# Patient Record
Sex: Male | Born: 1975 | Race: White | Hispanic: No | Marital: Married | Smoking: Former smoker
Health system: Southern US, Community
[De-identification: ages and names within clinical notes are randomized; demographics above are authoritative.]

## PROBLEM LIST (undated history)

## (undated) DIAGNOSIS — M4800 Spinal stenosis, site unspecified: Secondary | ICD-10-CM

## (undated) DIAGNOSIS — J439 Emphysema, unspecified: Secondary | ICD-10-CM

## (undated) DIAGNOSIS — G709 Myoneural disorder, unspecified: Secondary | ICD-10-CM

## (undated) DIAGNOSIS — K219 Gastro-esophageal reflux disease without esophagitis: Secondary | ICD-10-CM

## (undated) DIAGNOSIS — M1990 Unspecified osteoarthritis, unspecified site: Secondary | ICD-10-CM

## (undated) HISTORY — DX: Gastro-esophageal reflux disease without esophagitis: K21.9

## (undated) HISTORY — DX: Unspecified osteoarthritis, unspecified site: M19.90

## (undated) HISTORY — DX: Myoneural disorder, unspecified: G70.9

## (undated) HISTORY — DX: Emphysema, unspecified: J43.9

## (undated) HISTORY — PX: BACK SURGERY: SHX140

---

## 2015-03-21 ENCOUNTER — Emergency Department (HOSPITAL_COMMUNITY): Payer: BLUE CROSS/BLUE SHIELD

## 2015-03-21 ENCOUNTER — Encounter (HOSPITAL_COMMUNITY): Payer: Self-pay | Admitting: *Deleted

## 2015-03-21 ENCOUNTER — Emergency Department (HOSPITAL_COMMUNITY)
Admission: EM | Admit: 2015-03-21 | Discharge: 2015-03-21 | Disposition: A | Discharge status: Home / Self Care | Payer: BLUE CROSS/BLUE SHIELD | Attending: Emergency Medicine | Admitting: Emergency Medicine

## 2015-03-21 DIAGNOSIS — Z8739 Personal history of other diseases of the musculoskeletal system and connective tissue: Secondary | ICD-10-CM | POA: Diagnosis not present

## 2015-03-21 DIAGNOSIS — H539 Unspecified visual disturbance: Secondary | ICD-10-CM | POA: Diagnosis not present

## 2015-03-21 DIAGNOSIS — R0789 Other chest pain: Secondary | ICD-10-CM | POA: Diagnosis not present

## 2015-03-21 DIAGNOSIS — Z720 Tobacco use: Secondary | ICD-10-CM | POA: Insufficient documentation

## 2015-03-21 DIAGNOSIS — R079 Chest pain, unspecified: Secondary | ICD-10-CM | POA: Diagnosis present

## 2015-03-21 HISTORY — DX: Spinal stenosis, site unspecified: M48.00

## 2015-03-21 LAB — BASIC METABOLIC PANEL
ANION GAP: 8 (ref 5–15)
BUN: 11 mg/dL (ref 6–23)
CHLORIDE: 101 mmol/L (ref 96–112)
CO2: 28 mmol/L (ref 19–32)
CREATININE: 0.86 mg/dL (ref 0.50–1.35)
Calcium: 9.6 mg/dL (ref 8.4–10.5)
GFR calc Af Amer: >90 mL/min (ref >90)
GFR calc non Af Amer: >90 mL/min (ref >90)
Glucose, Bld: 107 mg/dL — ABNORMAL HIGH (ref 70–99)
Potassium: 4.1 mmol/L (ref 3.5–5.1)
SODIUM: 137 mmol/L (ref 135–145)

## 2015-03-21 LAB — CBC
HCT: 39.4 % (ref 39.0–52.0)
Hemoglobin: 13.7 g/dL (ref 13.0–17.0)
MCH: 31.5 pg (ref 26.0–34.0)
MCHC: 34.8 g/dL (ref 30.0–36.0)
MCV: 90.6 fL (ref 78.0–100.0)
Platelets: 189 10*3/uL (ref 150–400)
RBC: 4.35 MIL/uL (ref 4.22–5.81)
RDW: 13.1 % (ref 11.5–15.5)
WBC: 8 10*3/uL (ref 4.0–10.5)

## 2015-03-21 LAB — I-STAT TROPONIN, ED: TROPONIN I, POC: 0 ng/mL (ref 0.00–0.08)

## 2015-03-21 MED ORDER — ACETAMINOPHEN 500 MG PO TABS: 1000.0000 mg | ORAL_TABLET | Freq: Once | ORAL | Status: DC

## 2015-03-21 MED ORDER — SODIUM CHLORIDE 0.9 % IV BOLUS (SEPSIS)
1000.0000 mL | Freq: Once | INTRAVENOUS | Status: AC
  Administered 2015-03-21: 1000 mL via INTRAVENOUS

## 2015-03-21 NOTE — ED Notes (Signed)
Pt states that he began having vision changes at 0900.pt was sent for eval by urgent care.  Pt states that he has had pain in his eyes as well. Pt reports left leg numbness. Pt then began having chest pain and sweating. Pt alert and oriented. Dr Rubin Payorpickering aware

## 2015-03-21 NOTE — ED Provider Notes (Signed)
CSN: [id number]     Arrival date & time 03/21/15  1050 History   First MD Initiated Contact with Patient 03/21/15 1130     Chief Complaint  Patient presents with  . Chest Pain  . Eye Problem     (Consider location/radiation/quality/duration/timing/severity/associated sxs/prior Treatment) HPI Parker ArbourCharlie Arias is a 39 y.o. male who comes in for evaluation of multiple complaints. 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 He reports going to an urgent care facility at 9:15 for his blurred vision had resolved but he began to experience left-sided tingling and numbness and self reported left-sided facial droop. At this point he was sent to the ED for evaluation and upon arrival in the ED he began to experience "chest sweatiness followed by intermittent dull pains". He reports eye pain now but cannot lateralize his discomfort. He rates this discomfort as 5/10. Nothing makes it better or worse area and no intervention is tried. He does report being fairly active and has not experienced any discomfort following exercise. Denies headache, chest pain now, difficulties breathing, nausea or vomiting, abdominal pain, diarrhea or constipation, dizziness, syncope, rash.  Past Medical History  Diagnosis Date  . Spinal stenosis    History reviewed. No pertinent past surgical history. No family history on file. History  Substance Use Topics  . Smoking status: Current Every Day Smoker -- 0.50 packs/day    Types: Cigarettes  . Smokeless tobacco: Not on file  . Alcohol Use: Not on file    Review of Systems A 10 point review of systems was completed and was negative except for pertinent positives and negatives as mentioned in the history of present illness     Allergies  Review of patient's allergies indicates no known allergies.  Home Medications   Prior to Admission medications   Medication  Sig Start Date End Date Taking? Authorizing Provider  acetaminophen (TYLENOL) 325 MG tablet Take 650 mg by mouth every 6 (six) hours as needed for mild pain.   Yes Historical Provider, MD   BP 113/59 mmHg  Pulse 55  Temp(Src) 97.5 F (36.4 C) (Oral)  Resp 18  SpO2 98% Physical Exam  Constitutional: He is oriented to person, place, and time. He appears well-developed and well-nourished.  HENT:  Head: Normocephalic and atraumatic.  Mouth/Throat: Oropharynx is clear and moist.  Eyes: Conjunctivae are normal. Pupils are equal, round, and reactive to light. Right eye exhibits no discharge. Left eye exhibits no discharge. No scleral icterus.  Neck: Neck supple.  Cardiovascular: Normal rate, regular rhythm and normal heart sounds.   Pulmonary/Chest: Effort normal and breath sounds normal. No respiratory distress. He has no wheezes. He has no rales.  Abdominal: Soft. There is no tenderness.  Musculoskeletal: He exhibits no tenderness.  Neurological: He is alert and oriented to person, place, and time.  Cranial Nerves II-XII grossly intact. Strength and sensation intact. Completes finger to nose and heel to shin coordination movements without difficulty. No pronator drift. Extraocular movements intact with very mild horizontal nystagmus with extreme right lateral gaze. Eye pain only with extreme right lateral gaze  Skin: Skin is warm and dry. No rash noted.  Psychiatric: He has a normal mood and affect.  Nursing note and vitals reviewed.   ED Course  Procedures (including critical care time) Labs Review Labs Reviewed  BASIC METABOLIC PANEL - Abnormal; Notable for the following:    Glucose, Bld 107 (*)  All other components within normal limits  CBC  I-STAT TROPOININ, ED  Rosezena Sensor, ED    Imaging Review Dg Chest 2 View  03/21/2015   CLINICAL DATA:  Blurred vision. Tingling and numbness of the left arm. Lightheaded and dizzy. Symptoms started at 9 a.m. Chest pain.  EXAM: CHEST   2 VIEW  COMPARISON:  None.  FINDINGS: The heart size and mediastinal contours are within normal limits. Both lungs are clear. Moderate lumbar scratched of moderate thoracic spondylosis.  IMPRESSION: No evidence for acute cardiopulmonary abnormality.   Electronically Signed   By: Norva Pavlov M.D.   On: 03/21/2015 12:59     EKG Interpretation None      ED ECG REPORT   Date: 03/21/2015  Rate: 73  Rhythm: normal sinus rhythm  QRS Axis: normal  Intervals: normal  ST/T Wave abnormalities: normal  Conduction Disutrbances:none  Narrative Interpretation:   Old EKG Reviewed: none available  I have personally reviewed the EKG tracing and agree with the computerized printout as noted.  Meds given in ED:  Medications  acetaminophen (TYLENOL) tablet 1,000 mg (1,000 mg Oral Not Given 03/21/15 1605)  sodium chloride 0.9 % bolus 1,000 mL (0 mLs Intravenous Stopped 03/21/15 1302)    Discharge Medication List as of 03/21/2015  3:57 PM     Filed Vitals:   03/21/15 1500 03/21/15 1515 03/21/15 1530 03/21/15 1607  BP: 116/70 112/59 113/59   Pulse: 63 57 55   Temp:    97.5 F (36.4 C)  TempSrc:    Oral  Resp: SpO2: 100% 100% 98%     MDM  Vitals stable - WNL -afebrile Pt resting comfortably in ED. PE--normal neurological exam. Grossly benign physical exam. Labwork-labs essentially noncontributory. Imaging-chest x-ray shows no evidence of acute cardiopulmonary pathology. Discussed with patient and family in the room and they have decided to forego CT head. Reports they will return to ED if symptoms return.  DDX--patient with atypical chest discomfort, subjective, fleeting left-sided weakness and visual disturbances but have resolved in the ED. No evidence of emergent neurological pathology. Not consistent with ACS. Patient is a smoker but does not have any other risk factors for ACS. Doubt PE. We'll have patient follow-up with primary care for further valuation management of  symptoms.  I discussed all relevant lab findings and imaging results with pt and they verbalized understanding. Discussed f/u with PCP within 48 hrs and return precautions, pt very amenable to plan. Prior to patient discharge, I discussed and reviewed this case with Dr. Rubin Payor    Final diagnoses:  Chest discomfort  Visual disturbance        Joycie Peek, PA-C 03/21/15 1625  Benjiman Core, MD 03/22/15 [phone]

## 2015-03-21 NOTE — Discharge Instructions (Signed)
Chest Pain (Nonspecific) °It is often hard to give a specific diagnosis for the cause of chest pain. There is always a chance that your pain could be related to something serious, such as a heart attack or a blood clot in the lungs. You need to follow up with your health care provider for further evaluation. °CAUSES  °· Heartburn. °· Pneumonia or bronchitis. °· Anxiety or stress. °· Inflammation around your heart (pericarditis) or lung (pleuritis or pleurisy). °· A blood clot in the lung. °· A collapsed lung (pneumothorax). It can develop suddenly on its own (spontaneous pneumothorax) or from trauma to the chest. °· Shingles infection (herpes zoster virus). °The chest wall is composed of bones, muscles, and cartilage. Any of these can be the source of the pain. °· The bones can be bruised by injury. °· The muscles or cartilage can be strained by coughing or overwork. °· The cartilage can be affected by inflammation and become sore (costochondritis). °DIAGNOSIS  °Lab tests or other studies may be needed to find the cause of your pain. Your health care provider may have you take a test called an ambulatory electrocardiogram (ECG). An ECG records your heartbeat patterns over a 24-hour period. You may also have other tests, such as: °· Transthoracic echocardiogram (TTE). During echocardiography, sound waves are used to evaluate how blood flows through your heart. °· Transesophageal echocardiogram (TEE). °· Cardiac monitoring. This allows your health care provider to monitor your heart rate and rhythm in real time. °· Holter monitor. This is a portable device that records your heartbeat and can help diagnose heart arrhythmias. It allows your health care provider to track your heart activity for several days, if needed. °· Stress tests by exercise or by giving medicine that makes the heart beat faster. °TREATMENT  °· Treatment depends on what may be causing your chest pain. Treatment may include: °¨ Acid blockers for  heartburn. °¨ Anti-inflammatory medicine. °¨ Pain medicine for inflammatory conditions. °¨ Antibiotics if an infection is present. °· You may be advised to change lifestyle habits. This includes stopping smoking and avoiding alcohol, caffeine, and chocolate. °· You may be advised to keep your head raised (elevated) when sleeping. This reduces the chance of acid going backward from your stomach into your esophagus. °Most of the time, nonspecific chest pain will improve within 2-3 days with rest and mild pain medicine.  °HOME CARE INSTRUCTIONS  °· If antibiotics were prescribed, take them as directed. Finish them even if you start to feel better. °· For the next few days, avoid physical activities that bring on chest pain. Continue physical activities as directed. °· Do not use any tobacco products, including cigarettes, chewing tobacco, or electronic cigarettes. °· Avoid drinking alcohol. °· Only take medicine as directed by your health care provider. °· Follow your health care provider's suggestions for further testing if your chest pain does not go away. °· Keep any follow-up appointments you made. If you do not go to an appointment, you could develop lasting (chronic) problems with pain. If there is any problem keeping an appointment, call to reschedule. °SEEK MEDICAL CARE IF:  °· Your chest pain does not go away, even after treatment. °· You have a rash with blisters on your chest. °· You have a fever. °SEEK IMMEDIATE MEDICAL CARE IF:  °· You have increased chest pain or pain that spreads to your arm, neck, jaw, back, or abdomen. °· You have shortness of breath. °· You have an increasing cough, or you cough   up blood.  You have severe back or abdominal pain.  You feel nauseous or vomit.  You have severe weakness.  You faint.  You have chills. This is an emergency. Do not wait to see if the pain will go away. Get medical help at once. Call your local emergency services (911 in [place] Do not drive  yourself to the hospital. MAKE SURE YOU:   Understand these instructions.  Will watch your condition.  Will get help right away if you are not doing well or get worse. Document Released: 08/17/2005 Document Revised: 11/12/2013 Document Reviewed: 06/12/2008 [facility] Patient Information 2015 [place]. This information is not intended to replace advice given to you by your health care provider. Make sure you discuss any questions you have with your health care provider.  Blurred Vision You have been seen today complaining of blurred vision. This means you have a loss of ability to see small details.  CAUSES  Blurred vision can be a symptom of underlying eye problems, such as:  Aging of the eye (presbyopia).  Glaucoma.  Cataracts.  Eye infection.  Eye-related migraine.  Diabetes mellitus.  Fatigue.  Migraine headaches.  High blood pressure.  Breakdown of the back of the eye (macular degeneration).  Problems caused by some medications. The most common cause of blurred vision is the need for eyeglasses or a new prescription. Today in the emergency department, no cause for your blurred vision can be found. SYMPTOMS  Blurred vision is the loss of visual sharpness and detail (acuity). DIAGNOSIS  Should blurred vision continue, you should see your caregiver. If your caregiver is your primary care physician, he or she may choose to refer you to another specialist.  TREATMENT  Do not ignore your blurred vision. Make sure to have it checked out to see if further treatment or referral is necessary. SEEK MEDICAL CARE IF:  You are unable to get into a specialist so we can help you with a referral. SEEK IMMEDIATE MEDICAL CARE IF: You have severe eye pain, severe headache, or sudden loss of vision. MAKE SURE YOU:   Understand these instructions.  Will watch your condition.  Will get help right away if you are not doing well or get worse. Document Released: 11/10/2003  Document Revised: 01/30/2012 Document Reviewed: 06/11/2008 [facility] Patient Information 2015 [place], [place]. This information is not intended to replace advice given to you by your health care provider. Make sure you discuss any questions you have with your health care provider.  Exercise Stress Electrocardiogram An exercise stress electrocardiogram is a test to check how blood flows to your heart. It is done to find areas of poor blood flow. You will need to walk on a treadmill for this test. The electrocardiogram will record your heartbeat when you are at rest and when you are exercising. BEFORE THE PROCEDURE  Do not have drinks with caffeine or foods with caffeine for 24 hours before the test, or as told by your doctor. This includes coffee, tea (even decaf tea), sodas, chocolate, and cocoa.  Follow your doctor's instructions about eating and drinking before the test.  Ask your doctor what medicines you should or should not take before the test. Take your medicines with water unless told by your doctor not to.  If you use an inhaler, bring it with you to the test.  Bring a snack to eat after the test.  Do not  smoke for 4 hours before the test.  Do not put lotions, powders, creams,  or oils on your chest before the test.  Wear comfortable shoes and clothing. PROCEDURE  You will have patches put on your chest. Small areas of your chest may need to be shaved. Wires will be connected to the patches.  Your heart rate will be watched while you are resting and while you are exercising.  You will walk on the treadmill. The treadmill will slowly get faster to raise your heart rate.  The test will take about 1-2 hours. AFTER THE PROCEDURE  Your heart rate and blood pressure will be watched after the test.  You may return to your normal diet, activities, and medicines or as told by your doctor. Document Released: 04/25/2008 Document Revised: 03/24/2014 Document Reviewed:  07/15/2013 [facility] Patient Information 2015 [place], [place]. This information is not intended to replace advice given to you by your health care provider. Make sure you discuss any questions you have with your health care provider.  You were evaluated in the ED today for your symptoms. There is not appear to be an emergent cause her symptoms at this time. It is important to follow up with your primary care/[place] [facility] for further evaluation and management of your symptoms. Return to ED for new or worsening symptoms.

## 2015-03-21 NOTE — ED Notes (Signed)
Patient transported to X-ray 

## 2017-12-31 ENCOUNTER — Encounter (HOSPITAL_COMMUNITY): Payer: Self-pay | Admitting: Emergency Medicine

## 2017-12-31 ENCOUNTER — Emergency Department (HOSPITAL_COMMUNITY): Payer: BLUE CROSS/BLUE SHIELD

## 2017-12-31 ENCOUNTER — Emergency Department (HOSPITAL_COMMUNITY)
Admission: EM | Admit: 2017-12-31 | Discharge: 2017-12-31 | Disposition: A | Discharge status: Home / Self Care | Payer: BLUE CROSS/BLUE SHIELD | Attending: Emergency Medicine | Admitting: Emergency Medicine

## 2017-12-31 ENCOUNTER — Other Ambulatory Visit: Payer: Self-pay

## 2017-12-31 DIAGNOSIS — M5412 Radiculopathy, cervical region: Secondary | ICD-10-CM | POA: Diagnosis not present

## 2017-12-31 DIAGNOSIS — F17210 Nicotine dependence, cigarettes, uncomplicated: Secondary | ICD-10-CM | POA: Diagnosis not present

## 2017-12-31 DIAGNOSIS — R202 Paresthesia of skin: Secondary | ICD-10-CM | POA: Diagnosis present

## 2017-12-31 NOTE — ED Notes (Signed)
Pt refusing vitals at this time. Pt wanting to leave. This RN went over discharge instructions. Pt verbalized understanding.

## 2017-12-31 NOTE — ED Notes (Signed)
Pt ambulated to the restroom without difficulty. Gait steady/even. 

## 2017-12-31 NOTE — Discharge Instructions (Signed)
Please read attached information regarding your condition. Continue your home medications as previously prescribed. Follow-up with your neurologist for further evaluation. Return to ED for worsening injuries or falls, inability to feel legs or inability to walk, loss of bowel or bladder function.

## 2017-12-31 NOTE — ED Notes (Signed)
ED Provider at bedside. 

## 2017-12-31 NOTE — ED Provider Notes (Signed)
[facility] EMERGENCY DEPARTMENT Provider Note   CSN: [id number] Arrival date & time: 12/31/17  1319     History   Chief Complaint Chief Complaint  Patient presents with  . Arm Pain  . Leg Pain    HPI Parker Gomez is a 42 y.o. male with a past medical history of cervical spinal stenosis, who presents to ED for evaluation of acute onset intermittent blurry vision that began approximately 6 hours ago.  He reports "going in and out of blurry vision" which is not per his baseline.  He also reports ongoing bilateral upper and lower extremity tingling, "numbness" for the past several months which he is being evaluated by a neurologist for in about 5 days.  He describes the sensations as "like when I fall asleep on my arm."  He states his main concern is his blurry vision, as this is new for him.  He takes gabapentin and hydrocodone for these paresthesias and chronic back pain which improves his symptoms.  He denies any injury or falls at this time.  He states that symptoms began 3 months ago when he lifted a heavy box at work, causing a cervical strain.  He thinks that he return to work too soon per workers comp, so his strain never got better.  He denies any head injury, changes in gait, loss of bowel or bladder function, fevers, history of IV drug use, history of cancer, headaches, changes in gait.  HPI  Past Medical History:  Diagnosis Date  . Spinal stenosis     There are no active problems to display for this patient.   History reviewed. No pertinent surgical history.     Home Medications    Prior to Admission medications   Medication Sig Start Date End Date Taking? Authorizing Provider  acetaminophen (TYLENOL) 325 MG tablet Take 650 mg by mouth every 6 (six) hours as needed for mild pain.    [provider]    Family History History reviewed. No pertinent family history.  Social History Social History   Tobacco Use  . Smoking status:  Current Every Day Smoker    Packs/day: 0.50    Types: Cigarettes  . Smokeless tobacco: Never Used  Substance Use Topics  . Alcohol use: Yes    Frequency: Never    Comment: occasionally   . Drug use: No     Allergies   Patient has no known allergies.   Review of Systems Review of Systems  Constitutional: Negative for appetite change, chills and fever.  HENT: Negative for ear pain, rhinorrhea, sneezing and sore throat.   Eyes: Positive for visual disturbance. Negative for photophobia.  Respiratory: Negative for cough, chest tightness, shortness of breath and wheezing.   Cardiovascular: Negative for chest pain and palpitations.  Gastrointestinal: Negative for abdominal pain, blood in stool, constipation, diarrhea, nausea and vomiting.  Genitourinary: Negative for dysuria, hematuria and urgency.  Musculoskeletal: Positive for neck pain. Negative for myalgias.  Skin: Negative for rash.  Neurological: Positive for numbness. Negative for dizziness, weakness and light-headedness.     Physical Exam Updated Vital Signs BP 139/88 (BP Location: Right Arm)   Pulse 89   Temp 98.4 F (36.9 C) (Oral)   Resp 14   Ht 6\' 1"  (1.854 m)   Wt 106.6 kg (235 lb)   SpO2 100%   BMI 31.00 kg/m   Physical Exam  Constitutional: He is oriented to person, place, and time. He appears well-developed and well-nourished. No distress.  Nontoxic appearing and in no acute distress.  No facial droop noted.  Ambulatory with normal gait to bathroom.  HENT:  Head: Normocephalic and atraumatic.  Nose: Nose normal.  Eyes: Conjunctivae and EOM are normal. Pupils are equal, round, and reactive to light. Right eye exhibits no discharge. Left eye exhibits no discharge. No scleral icterus.  Neck: Normal range of motion. Neck supple.  Cardiovascular: Normal rate, regular rhythm, normal heart sounds and intact distal pulses. Exam reveals no gallop and no friction rub.  No murmur heard. Pulmonary/Chest: Effort  normal and breath sounds normal. No respiratory distress.  Abdominal: Soft. Bowel sounds are normal. He exhibits no distension. There is no tenderness. There is no guarding.  Musculoskeletal: Normal range of motion. He exhibits no edema.  No midline spinal tenderness present in lumbar, thoracic or cervical spine. No step-off palpated. No visible bruising, edema or temperature change noted. No objective signs of numbness present. No saddle anesthesia. 2+ DP pulses bilaterally.  Neurological: He is alert and oriented to person, place, and time. No cranial nerve deficit or sensory deficit. He exhibits normal muscle tone. Coordination normal.  Pupils reactive. No facial asymmetry noted. Cranial nerves appear grossly intact. Sensation intact to light touch on face, BUE and BLE. Strength 5/5 in BUE and BLE. Normal finger to nose coordination bilaterally.  Skin: Skin is warm and dry. No rash noted.  Psychiatric: He has a normal mood and affect.  Nursing note and vitals reviewed.    ED Treatments / Results  Labs (all labs ordered are listed, but only abnormal results are displayed) Labs Reviewed - No data to display  EKG  EKG Interpretation None       Radiology Ct Head Wo Contrast  Result Date: 12/31/2017 CLINICAL DATA:  Diplopia EXAM: CT HEAD WITHOUT CONTRAST TECHNIQUE: Contiguous axial images were obtained from the base of the skull through the vertex without intravenous contrast. COMPARISON:  None. FINDINGS: Brain: No acute intracranial abnormality. Specifically, no hemorrhage, hydrocephalus, mass lesion, acute infarction, or significant intracranial injury. Vascular: No hyperdense vessel or unexpected calcification. Skull: No acute calvarial abnormality. Sinuses/Orbits: Mucosal thickening in the maxillary sinuses. Mastoid air cells are clear. Orbital soft tissues unremarkable. Other: None IMPRESSION: No acute intracranial abnormality. Electronically Signed   By: Charlett NoseKevin  Dover M.D.   On:  12/31/2017 14:39    Procedures Procedures (including critical care time)  Medications Ordered in ED Medications - No data to display   Initial Impression / Assessment and Plan / ED Course  I have reviewed the triage vital signs and the nursing notes.  Pertinent labs & imaging results that were available during my care of the patient were reviewed by me and considered in my medical decision making (see chart for details).     Patient presents to ED for evaluation of acute onset intermittent blurry vision that began approximately 6 hours ago.  At the time of my evaluation, he denied any blurry vision.  He also states that he has been having ongoing bilateral upper and lower extremity tingling for the past several months for which he is being evaluated by a neurologist for in about 5 days.  His main concern today is his blurry vision.  He denies any facial droop, injuries or falls, headache, fever, changes in sensation of bilateral upper and lower extremities, loss of bowel or bladder function.  He has been ambulatory to the bathroom several times and I have observed him.  He has no deficits on his neurological exam at  this time.  There is no facial droop noted, changes in sensation of bilateral lower extremities, signs of saddle anesthesia.  He is overall well-appearing.  Head CT was obtained to evaluate for vision changes.  This returned as negative.  I reassured patient of his negative workup.  He then asked me "can I just get an MRI of my neck and back since I'm here?"  I educated the patient that MRI is used for emergent processes such as stroke, spinal cord injury.  Based on his unremarkable neurological exam, I have low suspicion of any spinal cord injury, intracranial process.  Patient expressed frustration because "I still do not know what is causing my blurry vision."  I did encourage him to follow-up with his neurologist possibly sooner for any further evaluation and workup of his symptoms.   Advised him to continue his home medications as previously prescribed.  Patient appears stable for discharge at this time.  Strict return precautions given.  Portions of this note were generated with Scientist, clinical (histocompatibility and immunogenetics). Dictation errors may occur despite best attempts at proofreading.   Final Clinical Impressions(s) / ED Diagnoses   Final diagnoses:  Cervical radiculopathy    ED Discharge Orders    None       Dietrich Pates, PA-C 12/31/17 1507    Maia Plan, MD 01/01/18 [phone]

## 2017-12-31 NOTE — ED Notes (Addendum)
States has an appointment with a neurologist this coming Friday. Denies incontinence of bowel/bladder.

## 2017-12-31 NOTE — ED Triage Notes (Signed)
Pt to ER for bilateral arm and leg numbness/pain x2 months. States it is intermittent. Hx of back issues. States had some blurred vision this morning, which has resolved at this time. VSS. A/o x4.

## 2018-02-07 DIAGNOSIS — R202 Paresthesia of skin: Secondary | ICD-10-CM | POA: Insufficient documentation

## 2018-02-20 DIAGNOSIS — G5601 Carpal tunnel syndrome, right upper limb: Secondary | ICD-10-CM | POA: Insufficient documentation

## 2018-09-28 DIAGNOSIS — M4802 Spinal stenosis, cervical region: Secondary | ICD-10-CM | POA: Insufficient documentation

## 2018-11-21 HISTORY — PX: CERVICAL SPINE SURGERY: SHX589

## 2019-11-23 ENCOUNTER — Emergency Department (HOSPITAL_COMMUNITY)
Admission: EM | Admit: 2019-11-23 | Discharge: 2019-11-23 | Disposition: A | Discharge status: Home / Self Care | Payer: BC Managed Care – PPO | Attending: Emergency Medicine | Admitting: Emergency Medicine

## 2019-11-23 ENCOUNTER — Other Ambulatory Visit: Payer: Self-pay

## 2019-11-23 ENCOUNTER — Encounter (HOSPITAL_COMMUNITY): Payer: Self-pay | Admitting: Emergency Medicine

## 2019-11-23 ENCOUNTER — Emergency Department (HOSPITAL_COMMUNITY): Payer: BC Managed Care – PPO

## 2019-11-23 DIAGNOSIS — F17210 Nicotine dependence, cigarettes, uncomplicated: Secondary | ICD-10-CM | POA: Diagnosis not present

## 2019-11-23 DIAGNOSIS — S61412A Laceration without foreign body of left hand, initial encounter: Secondary | ICD-10-CM | POA: Diagnosis present

## 2019-11-23 DIAGNOSIS — Y939 Activity, unspecified: Secondary | ICD-10-CM | POA: Insufficient documentation

## 2019-11-23 DIAGNOSIS — Y929 Unspecified place or not applicable: Secondary | ICD-10-CM | POA: Insufficient documentation

## 2019-11-23 DIAGNOSIS — Z23 Encounter for immunization: Secondary | ICD-10-CM | POA: Insufficient documentation

## 2019-11-23 DIAGNOSIS — W260XXA Contact with knife, initial encounter: Secondary | ICD-10-CM | POA: Insufficient documentation

## 2019-11-23 DIAGNOSIS — Y999 Unspecified external cause status: Secondary | ICD-10-CM | POA: Diagnosis not present

## 2019-11-23 MED ORDER — IBUPROFEN 400 MG PO TABS
400.0000 mg | ORAL_TABLET | Freq: Once | ORAL | Status: AC | PRN
  Administered 2019-11-23: 16:00:00 400 mg via ORAL

## 2019-11-23 MED ORDER — OXYCODONE-ACETAMINOPHEN 5-325 MG PO TABS: 1.0000 | ORAL_TABLET | ORAL | Status: DC | PRN

## 2019-11-23 MED ORDER — LIDOCAINE HCL (PF) 1 % IJ SOLN
10.0000 mL | Freq: Once | INTRAMUSCULAR | Status: AC
  Administered 2019-11-23: 10 mL
  Filled 2019-11-23: qty 10

## 2019-11-23 MED ORDER — CEPHALEXIN 500 MG PO CAPS: 500.0000 mg | ORAL_CAPSULE | Freq: Four times a day (QID) | ORAL | 0 refills | Status: DC

## 2019-11-23 MED ORDER — TETANUS-DIPHTH-ACELL PERTUSSIS 5-2.5-18.5 LF-MCG/0.5 IM SUSP
0.5000 mL | Freq: Once | INTRAMUSCULAR | Status: AC
  Administered 2019-11-23: 0.5 mL via INTRAMUSCULAR
  Filled 2019-11-23: qty 0.5

## 2019-11-23 NOTE — ED Triage Notes (Signed)
Pt arrives via EMS with reports of laceration to left hand with a knife. Bleeding control and dressing on hand.

## 2019-11-23 NOTE — ED Notes (Signed)
Patient verbalizes understanding of discharge instructions . Opportunity for questions and answers were provided . Armband removed by staff ,Pt discharged from ED. W/C  offered at D/C  and Declined W/C at D/C and was escorted to lobby by RN.  

## 2019-11-23 NOTE — Discharge Instructions (Signed)
Please read and follow all provided instructions.  Your diagnoses today include:  1. Laceration of left hand without foreign body, initial encounter     Tests performed today include:  X-ray of the affected area that did not show any foreign bodies or broken bones  Vital signs. See below for your results today.   Medications prescribed:  Please use over-the-counter NSAID medications (ibuprofen, naproxen) as directed on the packaging for pain.   Take any prescribed medications only as directed.   Home care instructions:  Follow any educational materials and wound care instructions contained in this packet.   Keep affected area above the level of your heart when possible to minimize swelling. Wash area gently twice a day with warm soapy water. Do not apply alcohol or hydrogen peroxide. Cover the area if it draining or weeping.   Follow-up instructions: Suture Removal: Return to the Emergency Department or see your primary care care doctor in 14 days for a recheck of your wound and removal of your sutures or staples.  Sutures should lose their strength and be able to be removed gently, however you may want to get them completely out if they are not removed in 14 days.  Return instructions:  Return to the Emergency Department if you have:  Fever  Worsening pain  Worsening swelling of the wound  Pus draining from the wound  Redness of the skin that moves away from the wound, especially if it streaks away from the affected area   Any other emergent concerns  Your vital signs today were: BP 131/80 (BP Location: Right Arm)   Pulse 70   Temp 98.2 F (36.8 C) (Oral)   Resp 16   SpO2 98%  If your blood pressure (BP) was elevated above 135/85 this visit, please have this repeated by your doctor within one month. --------------

## 2019-11-23 NOTE — ED Provider Notes (Signed)
[facility] EMERGENCY DEPARTMENT Provider Note   CSN: [id number] Arrival date & time: 11/23/19  1605     History Chief Complaint  Patient presents with  . Laceration    Mose Colaizzi is a 44 y.o. male.  Patient presents to the emergency department today with complaint of puncture wound to his left hand sustained just prior to arrival with a paring knife.  Patient applied a bandage and EMS was called.  EMS placed a pressure bandage.  Patient has unknown last tetanus.  No numbness or tingling of the hand.  No other injuries.  Onset of symptoms acute.  Pain is constant.  Worse with motion.         Past Medical History:  Diagnosis Date  . Spinal stenosis     There are no problems to display for this patient.   History reviewed. No pertinent surgical history.     No family history on file.  Social History   Tobacco Use  . Smoking status: Current Every Day Smoker    Packs/day: 0.50    Types: Cigarettes  . Smokeless tobacco: Never Used  Substance Use Topics  . Alcohol use: Yes    Comment: occasionally   . Drug use: No    Home Medications Prior to Admission medications   Medication Sig Start Date End Date Taking? Authorizing Provider  acetaminophen (TYLENOL) 325 MG tablet Take 650 mg by mouth every 6 (six) hours as needed for mild pain.    [provider]    Allergies    Patient has no known allergies.  Review of Systems   Review of Systems  Constitutional: Negative for activity change.  Musculoskeletal: Positive for arthralgias and myalgias. Negative for neck pain.  Skin: Positive for wound.  Neurological: Negative for weakness and numbness.    Physical Exam Updated Vital Signs BP 131/80 (BP Location: Right Arm)   Pulse 70   Temp 98.2 F (36.8 C) (Oral)   Resp 16   SpO2 98%   Physical Exam Vitals and nursing note reviewed.  Constitutional:      Appearance: He is well-developed.  HENT:     Head: Normocephalic and  atraumatic.  Eyes:     Conjunctiva/sclera: Conjunctivae normal.  Cardiovascular:     Pulses: Normal pulses. No decreased pulses.  Musculoskeletal:        General: Tenderness present.     Cervical back: Normal range of motion and neck supple.     Comments: Patient with a 1 cm laceration on the volar aspect of the webspace between the thumb and index fingers.  This is a puncture wound that extends approximately 3 cm volarly towards the distal aspect of the second metacarpal.  I do not get the sense that the wound extends all the way to the flexor tendon of the index finger.   Skin:    General: Skin is warm and dry.     Comments: Sensation of the left thumb, index, middle finger, ring finger, short finger intact distally.  Normal cap refill.  Attention paid to the flexion strength of the index finger given the location of the wound and he has normal strength in flexion and extension.  Wound was explored as best as possible using pickups and hemostat.  I was able to follow the tract of the puncture wound and approximately 3 cm in depth.  This ended in the general facility of the flexor tendon of the index finger, however I do not get the sense  that the depth of the wound extended into the flexor tendon.  This is consistent with the patient's exam.  Neurological:     Mental Status: He is alert.     Sensory: No sensory deficit.     Comments: Motor, sensation, and vascular distal to the injury is fully intact.      ED Results / Procedures / Treatments   Labs (all labs ordered are listed, but only abnormal results are displayed) Labs Reviewed - No data to display  EKG None  Radiology DG Hand Complete Left  Result Date: 11/23/2019 CLINICAL DATA:  Pt arrives via EMS with reports of laceration to left hand with a knife. Wound between 1st and 2nd finger. Bleeding control and dressing on hand. EXAM: LEFT HAND - COMPLETE 3+ VIEW COMPARISON:  None. FINDINGS: There is no evidence of fracture or  dislocation. There is no evidence of arthropathy or other focal bone abnormality. No radiopaque foreign body in the soft tissues. IMPRESSION: Negative radiographs of the left hand. Electronically Signed   By: Emmaline Kluver M.D.   On: 11/23/2019 16:38    Procedures .Marland KitchenLaceration Repair  Date/Time: 11/23/2019 6:23 PM Performed by: Renne Crigler, PA-C Authorized by: Renne Crigler, PA-C   Consent:    Consent obtained:  Verbal   Consent given by:  Patient   Risks discussed:  Infection, pain, retained foreign body, tendon damage, vascular damage, nerve damage, need for additional repair, poor cosmetic result and poor wound healing   Alternatives discussed:  No treatment Anesthesia (see MAR for exact dosages):    Anesthesia method:  Local infiltration   Local anesthetic:  Lidocaine 1% w/o epi Laceration details:    Location:  Hand   Hand location:  L hand, dorsum   Length (cm):  1   Depth (mm):  30 (puncture) Repair type:    Repair type:  Simple Pre-procedure details:    Preparation:  Patient was prepped and draped in usual sterile fashion and imaging obtained to evaluate for foreign bodies Exploration:    Hemostasis achieved with:  Direct pressure   Wound exploration: wound explored through full range of motion and entire depth of wound probed and visualized     Wound extent: no tendon damage noted and no underlying fracture noted     Contaminated: no   Treatment:    Area cleansed with:  Saline   Amount of cleaning:  Extensive   Irrigation solution:  Sterile saline   Irrigation volume:  500cc   Irrigation method:  Pressure wash (bulb syringe)   Visualized foreign bodies/material removed: no   Skin repair:    Repair method:  Sutures   Suture size:  4-0   Suture material:  Chromic gut   Suture technique:  Simple interrupted   Number of sutures:  4 Approximation:    Approximation:  Loose Post-procedure details:    Dressing:  Open (no dressing)   Patient tolerance of  procedure:  Tolerated well, no immediate complications Comments:     Special attention taken to irrigate wound as best as possible.  This was performed with a bulb syringe given depth.  I was able to insert the tip of the syringe into the wound to facilitate deep irrigation of the tissue.   (including critical care time)  Medications Ordered in ED Medications  ibuprofen (ADVIL) tablet 400 mg (400 mg Oral Given 11/23/19 1625)  Tdap (BOOSTRIX) injection 0.5 mL (0.5 mLs Intramuscular Given 11/23/19 1738)  lidocaine (PF) (XYLOCAINE) 1 % injection 10  mL (10 mLs Infiltration Given 11/23/19 1738)    ED Course  I have reviewed the triage vital signs and the nursing notes.  Pertinent labs & imaging results that were available during my care of the patient were reviewed by me and considered in my medical decision making (see chart for details).  Patient seen and examined. Bandage taken down. Patient has puncture wound which will need anesthetized and explored.  X-ray reviewed without signs of foreign body or fracture.  Patient appears to be neurovascularly intact.  Will give prophylactic Keflex given location of wound.  Vital signs reviewed and are as follows: BP 131/80 (BP Location: Right Arm)   Pulse 70   Temp 98.2 F (36.8 C) (Oral)   Resp 16   SpO2 98%   Wound irrigated, explored, and repaired as above.  Patient given 5 days of Keflex as prophylaxis.  We discussed signs and symptoms of infection and need to return if these occur.  Discussed that if sutures are still present or if he has any weakness in the fingers or any other concerns, she should follow-up with his primary care doctor in the next 1 to 2 weeks.  Discussed that he could have some decrease sensation in the hand/finger that is expected with superficial laceration.   MDM Rules/Calculators/A&P                      Patient with hand laceration as described above.  I do not suspect any retained foreign bodies or significant tendon,  vascular, or nerve injury at this time.  No indications for emergent orthopedic hand consultation.  Final Clinical Impression(s) / ED Diagnoses Final diagnoses:  Laceration of left hand without foreign body, initial encounter    Rx / DC Orders ED Discharge Orders    None       Renne Crigler, PA-C 11/23/19 1829    Milagros Loll, MD 11/25/19 1258

## 2020-11-23 ENCOUNTER — Ambulatory Visit
Admission: EM | Admit: 2020-11-23 | Discharge: 2020-11-23 | Disposition: A | Discharge status: Home / Self Care | Payer: BC Managed Care – PPO | Attending: Family Medicine | Admitting: Family Medicine

## 2020-11-23 DIAGNOSIS — Z20822 Contact with and (suspected) exposure to covid-19: Secondary | ICD-10-CM | POA: Diagnosis not present

## 2020-11-23 DIAGNOSIS — J069 Acute upper respiratory infection, unspecified: Secondary | ICD-10-CM

## 2020-11-23 MED ORDER — ALBUTEROL SULFATE HFA 108 (90 BASE) MCG/ACT IN AERS: 2.0000 | INHALATION_SPRAY | Freq: Four times a day (QID) | RESPIRATORY_TRACT | 0 refills | Status: DC | PRN

## 2020-11-23 MED ORDER — ALBUTEROL SULFATE HFA 108 (90 BASE) MCG/ACT IN AERS
2.0000 | INHALATION_SPRAY | Freq: Four times a day (QID) | RESPIRATORY_TRACT | Status: DC | PRN
  Administered 2020-11-23: 2 via RESPIRATORY_TRACT

## 2020-11-23 NOTE — ED Provider Notes (Signed)
Parker Gomez    CSN: [id number] Arrival date & time: 11/23/20  1030      History   Chief Complaint Chief Complaint  Patient presents with  . Headache    HPI Parker Gomez is a 45 y.o. male.   HPI  Patient presents for evaluation of headache, fever, loss of taste and smell, nasal congestion, increase work of breathing  x 1 day. Patient had COVID x 4 months ago. Unvaccinated. Currently daily smoker. Endorses fever x 1 day ago. Spouse is sick.   Past Medical History:  Diagnosis Date  . Spinal stenosis     There are no problems to display for this patient.   History reviewed. No pertinent surgical history.     Home Medications    Prior to Admission medications   Medication Sig Start Date End Date Taking? Authorizing Provider  acetaminophen (TYLENOL) 325 MG tablet Take 650 mg by mouth every 6 (six) hours as needed for mild pain.    [provider]  cephALEXin (KEFLEX) 500 MG capsule Take 1 capsule (500 mg total) by mouth 4 (four) times daily. 11/23/19   Renne Crigler, PA-C    Family History No family history on file.  Social History Social History   Tobacco Use  . Smoking status: Current Every Day Smoker    Packs/day: 0.50    Types: Cigarettes  . Smokeless tobacco: Never Used  Substance Use Topics  . Alcohol use: Yes    Comment: occasionally   . Drug use: No     Allergies   Patient has no known allergies.   Review of Systems Review of Systems Pertinent negatives listed in HPI Physical Exam Triage Vital Signs ED Triage Vitals  Enc Vitals Group     BP 11/23/20 1141 138/80     Pulse Rate 11/23/20 1141 68     Resp 11/23/20 1141 20     Temp 11/23/20 1141 98 F (36.7 C)     Temp Source 11/23/20 1141 Oral     SpO2 11/23/20 1141 98 %     Weight --      Height --      Head Circumference --      Peak Flow --      Pain Score 11/23/20 1146 0     Pain Loc --      Pain Edu? --      Excl. in GC? --    No data found.  Updated Vital  Signs BP 138/80   Pulse 68   Temp 98 F (36.7 C) (Oral)   Resp 20   SpO2 98%   Visual Acuity Right Eye Distance:   Left Eye Distance:   Bilateral Distance:    Right Eye Near:   Left Eye Near:    Bilateral Near:     Physical Exam  General Appearance:    Alert, ill-appearing, cooperative, no distress  HENT:   Normocephalic, ears normal, nares mucosal edema with congestion, rhinorrhea, oropharynx    Eyes:    PERRL, conjunctiva/corneas clear, EOM's intact       Lungs:     Clear to auscultation bilaterally, respirations unlabored  Heart:    Regular rate and rhythm  Neurologic:   Awake, alert, oriented x 3. No apparent focal neurological           defect.      UC Treatments / Results  Labs (all labs ordered are listed, but only abnormal results are displayed) Labs Reviewed - No data  to display  EKG   Radiology No results found.  Procedures Procedures (including critical care time)  Medications Ordered in UC Medications - No data to display  Initial Impression / Assessment and Plan / UC Course  I have reviewed the triage vital signs and the nursing notes.  Pertinent labs & imaging results that were available during my care of the patient were reviewed by me and considered in my medical decision making (see chart for details).     COVID/Flu test pending. Symptom management warranted only. Treatment per discharge medications. Red flag precautions given.  Final Clinical Impressions(s) / UC Diagnoses   Final diagnoses:  Suspected COVID-19 virus infection  Viral URI  Encounter for screening for COVID-19     Discharge Instructions     If shortness of breath worsens go immediately to the nearest Emergency department for evaluation. Covid test will result within 3-5 business days directly to your Mychart. Our office will contact you if the result is positive. All result will update to my chart.     ED Prescriptions    Medication Sig Dispense Auth. Provider    albuterol (VENTOLIN HFA) 108 (90 Base) MCG/ACT inhaler Inhale 2 puffs into the lungs every 6 (six) hours as needed for wheezing or shortness of breath. 18 g Bing Neighbors, FNP     PDMP not reviewed this encounter.   Bing Neighbors, [place] 11/23/20 2042

## 2020-11-23 NOTE — ED Triage Notes (Addendum)
Pt reports headache, fever, loss of taste and smell and nasal congestion that began yesterday. covid exposure last week.  Pt was covid+ in Sept.

## 2020-11-23 NOTE — Discharge Instructions (Signed)
If shortness of breath worsens go immediately to the nearest Emergency department for evaluation. Covid test will result within 3-5 business days directly to your Mychart. Our office will contact you if the result is positive. All result will update to my chart.

## 2020-11-26 LAB — COVID-19, FLU A+B NAA
Influenza A, NAA: NOT DETECTED
Influenza B, NAA: NOT DETECTED
SARS-CoV-2, NAA: NOT DETECTED

## 2021-01-05 ENCOUNTER — Emergency Department
Admission: EM | Admit: 2021-01-05 | Discharge: 2021-01-05 | Disposition: A | Discharge status: Home / Self Care | Payer: BC Managed Care – PPO | Attending: Emergency Medicine | Admitting: Emergency Medicine

## 2021-01-05 ENCOUNTER — Emergency Department: Payer: BC Managed Care – PPO

## 2021-01-05 DIAGNOSIS — R410 Disorientation, unspecified: Secondary | ICD-10-CM | POA: Diagnosis not present

## 2021-01-05 DIAGNOSIS — I776 Arteritis, unspecified: Secondary | ICD-10-CM | POA: Diagnosis not present

## 2021-01-05 DIAGNOSIS — R4182 Altered mental status, unspecified: Secondary | ICD-10-CM | POA: Diagnosis not present

## 2021-01-05 DIAGNOSIS — R519 Headache, unspecified: Secondary | ICD-10-CM | POA: Insufficient documentation

## 2021-01-05 DIAGNOSIS — R4701 Aphasia: Secondary | ICD-10-CM | POA: Diagnosis not present

## 2021-01-05 DIAGNOSIS — F17210 Nicotine dependence, cigarettes, uncomplicated: Secondary | ICD-10-CM | POA: Diagnosis not present

## 2021-01-05 DIAGNOSIS — R9082 White matter disease, unspecified: Secondary | ICD-10-CM | POA: Diagnosis not present

## 2021-01-05 DIAGNOSIS — R202 Paresthesia of skin: Secondary | ICD-10-CM | POA: Diagnosis not present

## 2021-01-05 DIAGNOSIS — H538 Other visual disturbances: Secondary | ICD-10-CM | POA: Diagnosis not present

## 2021-01-05 DIAGNOSIS — Z8739 Personal history of other diseases of the musculoskeletal system and connective tissue: Secondary | ICD-10-CM | POA: Insufficient documentation

## 2021-01-05 LAB — COMPREHENSIVE METABOLIC PANEL
ALT: 32 U/L (ref 0–44)
AST: 23 U/L (ref 15–41)
Albumin: 4.7 g/dL (ref 3.5–5.0)
Alkaline Phosphatase: 68 U/L (ref 38–126)
Anion gap: 11 (ref 5–15)
BUN: 15 mg/dL (ref 6–20)
CO2: 23 mmol/L (ref 22–32)
Calcium: 9.6 mg/dL (ref 8.9–10.3)
Chloride: 102 mmol/L (ref 98–111)
Creatinine, Ser: 0.9 mg/dL (ref 0.61–1.24)
GFR, Estimated: >60 mL/min (ref >60)
Glucose, Bld: 98 mg/dL (ref 70–99)
Potassium: 3.8 mmol/L (ref 3.5–5.1)
Sodium: 136 mmol/L (ref 135–145)
Total Bilirubin: 0.9 mg/dL (ref 0.3–1.2)
Total Protein: 6.9 g/dL (ref 6.5–8.1)

## 2021-01-05 LAB — DIFFERENTIAL
Abs Immature Granulocytes: 0.02 10*3/uL (ref 0.00–0.07)
Basophils Absolute: 0 10*3/uL (ref 0.0–0.1)
Basophils Relative: 0 %
Eosinophils Absolute: 0.2 10*3/uL (ref 0.0–0.5)
Eosinophils Relative: 2 %
Immature Granulocytes: 0 %
Lymphocytes Relative: 26 %
Lymphs Abs: 2.2 10*3/uL (ref 0.7–4.0)
Monocytes Absolute: 0.7 10*3/uL (ref 0.1–1.0)
Monocytes Relative: 8 %
Neutro Abs: 5.3 10*3/uL (ref 1.7–7.7)
Neutrophils Relative %: 64 %

## 2021-01-05 LAB — CBG MONITORING, ED: Glucose-Capillary: 88 mg/dL (ref 70–99)

## 2021-01-05 LAB — PROTIME-INR
INR: 1.1 (ref 0.8–1.2)
Prothrombin Time: 13.8 seconds (ref 11.4–15.2)

## 2021-01-05 LAB — CBC
HCT: 40.9 % (ref 39.0–52.0)
Hemoglobin: 14.3 g/dL (ref 13.0–17.0)
MCH: 32.5 pg (ref 26.0–34.0)
MCHC: 35 g/dL (ref 30.0–36.0)
MCV: 93 fL (ref 80.0–100.0)
Platelets: 284 10*3/uL (ref 150–400)
RBC: 4.4 MIL/uL (ref 4.22–5.81)
RDW: 12.5 % (ref 11.5–15.5)
WBC: 8.4 10*3/uL (ref 4.0–10.5)
nRBC: 0 % (ref 0.0–0.2)

## 2021-01-05 LAB — APTT: aPTT: 29 seconds (ref 24–36)

## 2021-01-05 MED ORDER — BUTALBITAL-APAP-CAFFEINE 50-325-40 MG PO TABS
2.0000 | ORAL_TABLET | Freq: Once | ORAL | Status: AC
  Administered 2021-01-05: 2 via ORAL
  Filled 2021-01-05: qty 2

## 2021-01-05 MED ORDER — SODIUM CHLORIDE 0.9% FLUSH
3.0000 mL | Freq: Once | INTRAVENOUS | Status: AC
Start: 2021-01-05 — End: 2021-01-05
  Administered 2021-01-05: 3 mL via INTRAVENOUS

## 2021-01-05 NOTE — Consult Note (Signed)
NEUROLOGY CONSULTATION NOTE   Date of service: January 05, 2021 Patient Name: Parker Gomez MRN:  [id number] DOB:  26-Feb-1976 Reason for consult: "Stroke code" _ _ _   _ __   _ __ _ _  __ __   _ __   __ _  History of Present Illness  Parker Gomez is a 45 y.o. male with PMH significant for spinal stenosis who presents with acute onset BL blurred vision, difficulty with finding words, numbness and tingling in hand and feet along with feeling off balance. He was at work today when his symptoms started around 1430 on 01/05/21. Started off as blurred vision bilaterally for about 20 mins with pain behind his eyes, then around quarter to 3 noted he had trouble focusing. He endorses confusion overall but feels that the problems with words was much worse and he has never had anything like this in the past. His symptoms are rapidly improving and he feels like the blurred vision is gone.  He does endorse some upper respiratory symptoms over the last few days, denies any prior history of stroke. Smokes half a pack per day.  MRS: 0 NIHSS: 0 tPA and thrbomectomy: Not offered due to resolution of symptoms.   ROS   Constitutional Denies weight loss, fever and chills.  HEENT Denies changes in vision and hearing.  Respiratory Denies SOB and cough.  CV Denies palpitations and CP  GI Denies abdominal pain, nausea, vomiting and diarrhea.  GU Denies dysuria and urinary frequency.  MSK Denies myalgia and joint pain.  Skin Denies rash and pruritus  Neurological Endorses headache, no syncope.  Psychiatric Denies recent changes in mood. Denies anxiety and depression.   Past History   Past Medical History:  Diagnosis Date  . Spinal stenosis    History reviewed. No pertinent surgical history. No family history on file. Social History   Socioeconomic History  . Marital status: Married    Spouse name: Not on file  . Number of children: Not on file  . Years of education: Not on file  . Highest  education level: Not on file  Occupational History  . Not on file  Tobacco Use  . Smoking status: Current Every Day Smoker    Packs/day: 0.50    Types: Cigarettes  . Smokeless tobacco: Never Used  Substance and Sexual Activity  . Alcohol use: Yes    Comment: occasionally   . Drug use: No  . Sexual activity: Not on file  Other Topics Concern  . Not on file  Social History Narrative  . Not on file   Social Determinants of Health   Financial Resource Strain: Not on file  Food Insecurity: Not on file  Transportation Needs: Not on file  Physical Activity: Not on file  Stress: Not on file  Social Connections: Not on file   No Known Allergies  Medications  (Not in a hospital admission)    Vitals   Vitals:   01/05/21 1600 01/05/21 1629 01/05/21 1700  BP: (!) 161/108 (!) 141/87 (!) 143/78  Pulse: 76 62 62  Resp: 18 18 16   Temp: 98 F (36.7 C)    SpO2: 100% 99% 100%     There is no height or weight on file to calculate BMI.  Physical Exam   General: Laying comfortably in bed; in no acute distress. HENT: Normal oropharynx and mucosa. Normal external appearance of ears and nose. Neck: Supple, no pain or tenderness  CV: No JVD. No peripheral edema.  Pulmonary: Symmetric Chest rise. Normal respiratory effort.  Abdomen: Soft to touch, non-tender.  Ext: No cyanosis, edema, or deformity  Skin: No rash. Normal palpation of skin.   Musculoskeletal: Normal digits and nails by inspection. No clubbing.   Neurologic Examination  Mental status/Cognition: Alert, oriented to self, place, month and year, good attention.  Speech/language: Fluent, comprehension intact, object naming intact, repetition intact.  Cranial nerves:   CN II Pupils equal and reactive to light, no VF deficits    CN III,IV,VI EOM intact, no gaze preference or deviation, no nystagmus    CN V normal sensation in V1, V2, and V3 segments bilaterally    CN VII no asymmetry, no nasolabial fold flattening    CN  VIII normal hearing to speech    CN IX & X normal palatal elevation, no uvular deviation    CN XI 5/5 head turn and 5/5 shoulder shrug bilaterally    CN XII midline tongue protrusion    Motor:  Muscle bulk: normal, tone normal, pronator drift none tremor none Mvmt Root Nerve  Muscle Right Left Comments  SA C5/6 Ax Deltoid 5 5   EF C5/6 Mc Biceps 5 5   EE C6/7/8 Rad Triceps 5 5   WF C6/7 Med FCR 5 5   WE C7/8 PIN ECU 5 5   F Ab C8/T1 U ADM/FDI 5 5   HF L1/2/3 Fem Illopsoas 5 5   KE L2/3/4 Fem Quad 5 5   DF L4/5 D Peron Tib Ant 5 5   PF S1/2 Tibial Grc/Sol 5 5    Reflexes:  Right Left Comments  Pectoralis      Biceps (C5/6) 1 1   Brachioradialis (C5/6) 1 1    Triceps (C6/7) 1 1    Patellar (L3/4) 1 1    Achilles (S1)      Hoffman      Plantar     Jaw jerk    Sensation:  Light touch Intact throughout   Pin prick    Temperature    Vibration   Proprioception    Coordination/Complex Motor:  - Finger to Nose intact BL - Heel to shin intact BL - Rapid alternating movement are normal - Gait: Deferred.  Labs   CBC:  Recent Labs  Lab 01/05/21 1627  WBC 8.4  NEUTROABS 5.3  HGB 14.3  HCT 40.9  MCV 93.0  PLT 284    Basic Metabolic Panel:  Lab Results  Component Value Date   NA 136 01/05/2021   K 3.8 01/05/2021   CO2 23 01/05/2021   GLUCOSE 98 01/05/2021   BUN 15 01/05/2021   CREATININE 0.90 01/05/2021   CALCIUM 9.6 01/05/2021   GFRNONAA >60 01/05/2021   GFRAA >90 03/21/2015   Lipid Panel: No results found for: LDLCALC HgbA1c: No results found for: HGBA1C Urine Drug Screen: No results found for: LABOPIA, COCAINSCRNUR, LABBENZ, AMPHETMU, THCU, LABBARB  Alcohol Level No results found for: ETH  CT Head without contrast: CTH was negative for a large hypodensity concerning for a large territory infarct or hyperdensity concerning for an ICH  MRI Brain Pending  Impression   Parker Gomez is a 45 y.o. male with PMH significant for spinal stenosis who  presents with an episode of BL blurred vision with headache that self resolved, followed by confusion and word finding difficulty with off balance and tingling in both hand sand feets. On my evaluation after the [facility], his symptoms had resolved. Except for the word finding difficulty,  none of his other symptoms are concerning for a focal deficit. Will get an MRI Brain to definitively rule out a stroke or TIA but my suspicion is low.  tPA was not offered due to low NIHSS and low concern for a stroke based on his symptoms alone. I did discuss with him that he should let us know if there is a recurrence of his symptoms or if he develops any new symptoms.  Recommendations  - MRI Brain without contrast. - No further workup if the MRI Brain is negative. ______________________________________________________________________   Thank you for the opportunity to take part in the care of this patient. If you have any further questions, please contact the neurology consultation attending.  Signed,  Erick Blinks [facility] Pager Number [phone] _ _ _   _ __   _ __ _ _  __ __   _ __   __ _

## 2021-01-05 NOTE — Discharge Instructions (Signed)
Stop smoking cigarettes.  If you continue to smoke you will likely have a stroke or heart attack later in life.  Use Tylenol for pain and fevers.  Up to 1000 mg per dose, up to 4 times per day.  Do not take more than 4000 mg of Tylenol/acetaminophen within 24 hours..  Follow-up with neurology in the clinic, I have attached their phone number to call the office to schedule this appointment.  Return to the ED with any further episodes concerning for stroke.

## 2021-01-05 NOTE — ED Provider Notes (Signed)
[facility] Emergency Department Provider Note ____________________________________________   Event Date/Time   First MD Initiated Contact with Patient 01/05/21 1610     (approximate)  I have reviewed the triage vital signs and the nursing notes.  HISTORY  Chief Complaint Code Stroke   HPI Parker Gomez is a 45 y.o. malewho presents to the ED for evaluation of possible stroke.  Chart review indicates no relevant medical history. Patient reports he takes no regular prescription medications and has had no recent illnesses. Reports smoking 1/2 PPD.  Social alcohol drinking.  No other recreational drugs.  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  Reports he was having a normal day, then at 2:30 PM he suddenly had decrease visual acuity throughout his visual fields with associated headache behind his bilateral eyes.  Reports this lasted about 15 minutes before self resolving and his vision has been at baseline since then.  Immediately after this, he reports feeling generalized confusion and word finding difficulties such that his coworkers urged him to come to the ED for evaluation.  Resolving word finding difficulties on arrival to the ED.  Reports residual headache behind his bilateral eyes as his only complaint.  7/10 intensity, aching and nonradiating.   Past Medical History:  Diagnosis Date  . Spinal stenosis     There are no problems to display for this patient.   History reviewed. No pertinent surgical history.  Prior to Admission medications   Medication Sig Start Date End Date Taking? Authorizing Provider  acetaminophen (TYLENOL) 325 MG tablet Take 650 mg by mouth every 6 (six) hours as needed for mild pain.    [provider]  albuterol (VENTOLIN HFA) 108 (90 Base) MCG/ACT inhaler Inhale 2 puffs into the lungs every 6 (six) hours as needed for wheezing or shortness of breath. 11/23/20   Bing Neighbors, FNP  cephALEXin  (KEFLEX) 500 MG capsule Take 1 capsule (500 mg total) by mouth 4 (four) times daily. 11/23/19   Renne Crigler, PA-C    Allergies Patient has no known allergies.  No family history on file.  Social History Social History   Tobacco Use  . Smoking status: Current Every Day Smoker    Packs/day: 0.50    Types: Cigarettes  . Smokeless tobacco: Never Used  Substance Use Topics  . Alcohol use: Yes    Comment: occasionally   . Drug use: No    Review of Systems  Constitutional: No fever/chills Eyes: No visual changes. ENT: No sore throat. Cardiovascular: Denies chest pain. Respiratory: Denies shortness of breath. Gastrointestinal: No abdominal pain.  No nausea, no vomiting.  No diarrhea.  No constipation. Genitourinary: Negative for dysuria. Musculoskeletal: Negative for back pain. Skin: Negative for rash. Neurological: Negative for focal weakness or numbness.  Positive for headache, reporting difficulties and transient visual loss.  ____________________________________________   PHYSICAL EXAM:  VITAL SIGNS: Vitals:   01/05/21 1700 01/05/21 1800  BP: (!) 143/78 123/75  Pulse: 62 61  Resp: 16 15  Temp:    SpO2: 100% 100%     Constitutional: Alert and oriented. Well appearing and in no acute distress. Eyes: Conjunctivae are normal. PERRL. EOMI. Head: Atraumatic. Nose: No congestion/rhinnorhea. Mouth/Throat: Mucous membranes are moist.  Oropharynx non-erythematous. Neck: No stridor. No cervical spine tenderness to palpation. Cardiovascular: Normal rate, regular rhythm. Grossly normal heart sounds.  Good peripheral circulation. Respiratory: Normal respiratory effort.  No retractions. Lungs CTAB. Gastrointestinal: Soft , nondistended, nontender to palpation. No CVA tenderness. Musculoskeletal:  No lower extremity tenderness nor edema.  No joint effusions. No signs of acute trauma. Neurologic:  Normal speech and language. No gross focal neurologic deficits are appreciated.  No gait instability noted. Cranial nerves II through XII intact 5/5 strength and sensation in all 4 extremities NIH of 0 Skin:  Skin is warm, dry and intact. No rash noted. Psychiatric: Mood and affect are normal. Speech and behavior are normal.  ____________________________________________   LABS (all labs ordered are listed, but only abnormal results are displayed)  Labs Reviewed  PROTIME-INR  APTT  CBC  DIFFERENTIAL  COMPREHENSIVE METABOLIC PANEL  CBG MONITORING, ED  I-STAT CREATININE, ED  CBG MONITORING, ED   ____________________________________________  12 Lead EKG   ____________________________________________  RADIOLOGY  ED MD interpretation: CT head reviewed by me without evidence of acute intracranial pathology.  Official radiology report(s): MR BRAIN WO CONTRAST  Result Date: 01/05/2021 CLINICAL DATA:  Acute onset blurred vision, word-finding difficulty, numbness and tingling in the hands and feet, and imbalance. EXAM: MRI HEAD WITHOUT CONTRAST TECHNIQUE: Multiplanar, multiecho pulse sequences of the brain and surrounding structures were obtained without intravenous contrast. COMPARISON:  Head CT 01/05/2021 FINDINGS: Brain: There is no evidence of an acute infarct, intracranial hemorrhage, mass, midline shift, or extra-axial fluid collection. The ventricles and sulci are normal. There are multiple small foci of T2 hyperintensity in the cerebral white matter bilaterally, greatest in the right corona radiata. White matter disease is mild in overall severity but abnormal for age. Vascular: Major intracranial vascular flow voids are preserved. Skull and upper cervical spine: Unremarkable bone marrow signal. Sinuses/Orbits: Unremarkable orbits. Paranasal sinuses and mastoid air cells are clear. Other: None. IMPRESSION: 1. No acute intracranial abnormality. 2. Mild cerebral white matter T2 signal changes, abnormal for age and nonspecific. Considerations include early chronic  small vessel ischemia, migraines, sequelae of trauma, hypercoagulable state, vasculitis, prior infection and demyelination. Electronically Signed   By: Sebastian Ache M.D.   On: 01/05/2021 18:59   CT HEAD CODE STROKE WO CONTRAST`  Result Date: 01/05/2021 CLINICAL DATA:  Code stroke. Numbness or tingling, paresthesia. Headache. EXAM: CT HEAD WITHOUT CONTRAST TECHNIQUE: Contiguous axial images were obtained from the base of the skull through the vertex without intravenous contrast. COMPARISON:  CT head December 31, 2017 FINDINGS: Brain: No evidence of acute large vascular territory infarction, hemorrhage, hydrocephalus, extra-axial collection or mass lesion/mass effect. Mild patchy white matter hypoattenuation, nonspecific but most likely related to chronic microvascular ischemic disease. Small area of hypoattenuation in the inferior right basal ganglia is similar to prior. Vascular: No hyperdense vessel identified. Skull: No acute fracture. Sinuses/Orbits: Visualized sinuses are clear.  Unremarkable orbits. Other: No mastoid effusions. ASPECTS [facility] Stroke Program Early CT Score) Total score (0-10 with 10 being normal): 10. IMPRESSION: 1. No evidence of acute intracranial abnormality. ASPECTS is 10. 2. Mild scattered white matter hypoattenuation, nonspecific but most likely related to chronic microvascular ischemic disease. 3. Small area of hypoattenuation in the inferior right basal ganglia is similar to prior and may represent a dilated perivascular space (favored) versus prior lacunar infarct. Code stroke imaging results were communicated on 01/05/2021 at 4:21 pm to provider Dr. Larinda Buttery Via telephone, who verbally acknowledged these results. Electronically Signed   By: Feliberto Harts MD   On: 01/05/2021 16:24    ____________________________________________   PROCEDURES and INTERVENTIONS  Procedure(s) performed (including Critical Care):  .1-3 Lead EKG Interpretation Performed by: Delton Prairie,  MD Authorized by: Delton Prairie, MD     Interpretation: normal  ECG rate:  64   ECG rate assessment: normal     Rhythm: sinus rhythm     Ectopy: none     Conduction: normal      Medications  sodium chloride flush (NS) 0.9 % injection 3 mL (3 mLs Intravenous Given 01/05/21 1658)  butalbital-acetaminophen-caffeine (FIORICET) 50-325-40 MG per tablet 2 tablet (2 tablets Oral Given 01/05/21 1641)    ____________________________________________   MDM / ED COURSE   Patient presents to the ED with resolved complaints of aphasia and blurry vision, of uncertain etiology, and ultimately amenable to outpatient management.  Slightly hypertensive on arrival self resolves, vitals otherwise normal on room air.  Exam without evidence of any focal neurologic deficits, distress, or signs of trauma.  He presents with an NIH of 0 and a bitemporal headache, resolved after Fioricet administration.  His work-up is benign without evidence of CVA, ICH or any pathology.  Blood work shows no acute derangements.  Patient has no recurrence of symptoms while in the ED .  MRI brain without evidence of acute intracranial pathology.  I discussed the case with neurology who agrees that there is no evidence of acute pathology to warrant stroke work-up, TIA work-up or hospitalization at this time.  Thoroughly discussed smoking cessation with the patient and ED return precautions.  We will have him follow-up with neurology as an outpatient.  Patient medically stable for outpatient management.  Clinical Course as of 01/05/21 1932  Tue Jan 05, 2021  1617 Neuro in the room with me evaluating the patient [DS]  1815 Reassessed.  Patient reports resolution of headache.  Wife is now at the bedside and provides additional history.  I answered questions.  We are waiting for MRI at this point. [DS]  1905 MRI noted. Neuro paged [DS]  1912 I discussed the case and MRI results with Dr. Derry Lory.  He indicates that there is nothing else  to do acutely and that the patient should follow up with neurology as an outpatient. [DS]  1914 I educated patient and wife and the importance of smoking cessation.  We discussed his increased risk for stroke due to this.  We discussed steps for smoking cessation.  Answered questions. [DS]    Clinical Course User Index [DS] Delton Prairie, MD    ____________________________________________   FINAL CLINICAL IMPRESSION(S) / ED DIAGNOSES  Final diagnoses:  Aphasia  Confusion  Acute nonintractable headache, unspecified headache type     ED Discharge Orders    None       Willean Schurman   Note:  This document was prepared using Dragon voice recognition software and may include unintentional dictation errors.   Delton Prairie, MD 01/05/21 [phone]

## 2021-01-05 NOTE — ED Notes (Signed)
Pt in CT.

## 2021-01-05 NOTE — ED Notes (Signed)
MD Tamala Julian at bedside. Neurologist met in hallway from CT and taken to ED 14 with pt. Pt assisted into bed. Pt states I just don't feel right.

## 2021-01-05 NOTE — ED Notes (Signed)
Code Stroke called to [facility] 

## 2021-01-05 NOTE — Consult Note (Signed)
CODE STROKE- PHARMACY COMMUNICATION   Time CODE STROKE called/page received:1609  Time response to CODE STROKE was made (in person or via phone): 1618  Time Stroke Kit retrieved from [place] (only if needed):pulled but replaced (not used)  Name of Provider/Nurse contacted:Dr Lorrin Goodell  Past Medical History:  Diagnosis Date  . Spinal stenosis    Prior to Admission medications   Medication Sig Start Date End Date Taking? Authorizing Provider  acetaminophen (TYLENOL) 325 MG tablet Take 650 mg by mouth every 6 (six) hours as needed for mild pain.    [provider]  albuterol (VENTOLIN HFA) 108 (90 Base) MCG/ACT inhaler Inhale 2 puffs into the lungs every 6 (six) hours as needed for wheezing or shortness of breath. 11/23/20   Scot Jun, FNP  cephALEXin (KEFLEX) 500 MG capsule Take 1 capsule (500 mg total) by mouth 4 (four) times daily. 11/23/19   Carlisle Cater, PA-C    Lu Duffel ,PharmD Clinical Pharmacist  01/05/2021  4:13 PM

## 2021-01-05 NOTE — ED Notes (Signed)
Pt taken to ED 14 at this time by this RN

## 2021-01-05 NOTE — ED Triage Notes (Addendum)
Pt comes with c/o massive headache and numbness that started around 1430 today. Pt states he was at work and his vision went blurry, and felt he couldn't get his words out.  Pt states shortly after he couldn't pronounce words. Pt states he just feels weird and kinda unbalanced.  First RN notified CODE STROKE called. Pt taken to CT at this time.

## 2021-01-05 NOTE — ED Notes (Addendum)
RN Wynona Canes called and informed of pt in room and CODE STROKE has been called.  Neuro cart taken to room and button pressed at this time.

## 2021-01-06 DIAGNOSIS — R519 Headache, unspecified: Secondary | ICD-10-CM | POA: Diagnosis not present

## 2021-01-06 DIAGNOSIS — H539 Unspecified visual disturbance: Secondary | ICD-10-CM | POA: Diagnosis not present

## 2021-01-06 DIAGNOSIS — R202 Paresthesia of skin: Secondary | ICD-10-CM | POA: Diagnosis not present

## 2021-09-05 DIAGNOSIS — R000 Tachycardia, unspecified: Secondary | ICD-10-CM | POA: Diagnosis not present

## 2021-09-05 DIAGNOSIS — R509 Fever, unspecified: Secondary | ICD-10-CM | POA: Diagnosis not present

## 2021-09-05 DIAGNOSIS — R0902 Hypoxemia: Secondary | ICD-10-CM | POA: Diagnosis not present

## 2021-09-05 DIAGNOSIS — R231 Pallor: Secondary | ICD-10-CM | POA: Diagnosis not present

## 2021-09-06 DIAGNOSIS — F17200 Nicotine dependence, unspecified, uncomplicated: Secondary | ICD-10-CM | POA: Diagnosis not present

## 2021-09-06 DIAGNOSIS — Z20822 Contact with and (suspected) exposure to covid-19: Secondary | ICD-10-CM | POA: Diagnosis not present

## 2021-09-06 DIAGNOSIS — J069 Acute upper respiratory infection, unspecified: Secondary | ICD-10-CM | POA: Diagnosis not present

## 2021-09-10 DIAGNOSIS — J069 Acute upper respiratory infection, unspecified: Secondary | ICD-10-CM | POA: Diagnosis not present

## 2022-02-17 DIAGNOSIS — R1111 Vomiting without nausea: Secondary | ICD-10-CM | POA: Diagnosis not present

## 2022-02-17 DIAGNOSIS — R1084 Generalized abdominal pain: Secondary | ICD-10-CM | POA: Diagnosis not present

## 2022-04-05 IMAGING — CT CT HEAD CODE STROKE
4 series · 16 of 47 positions shown, 18 images · non-contrast
Comparison: CT head December 31, 2017

CLINICAL DATA: Code stroke. Numbness or tingling, paresthesia.
Headache.

EXAM:
CT HEAD WITHOUT CONTRAST
TECHNIQUE: Contiguous axial images were obtained from the base of the skull
through the vertex without intravenous contrast.

[Series 3: head bone · axial · 0.43mm/px · z∈[+282,+312]mm · 3 of 75 slices shown]
[im 8/75  bone]
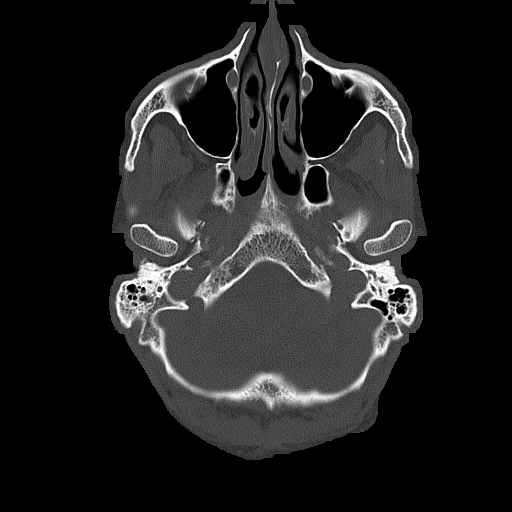
[im 15/75  bone]
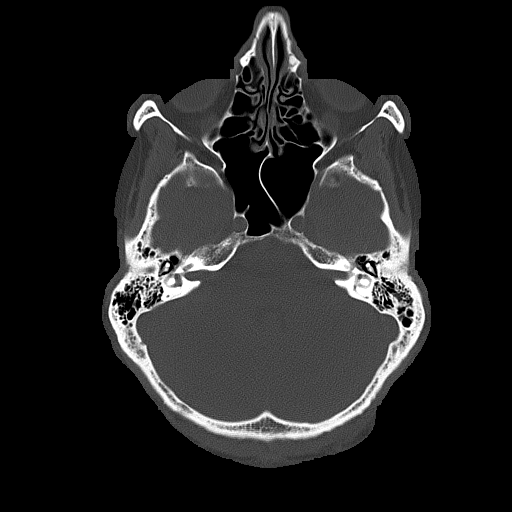
[im 23/75  bone]
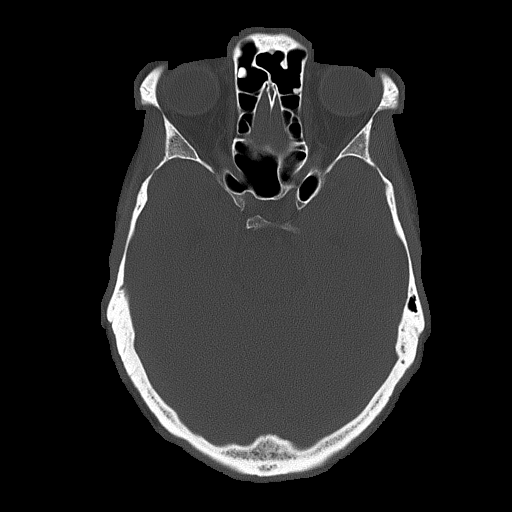

[Series 4: coronal soft tissue · coronal · 0.29mm/px · 3 of 74 slices shown]
[im 25/74  brain]
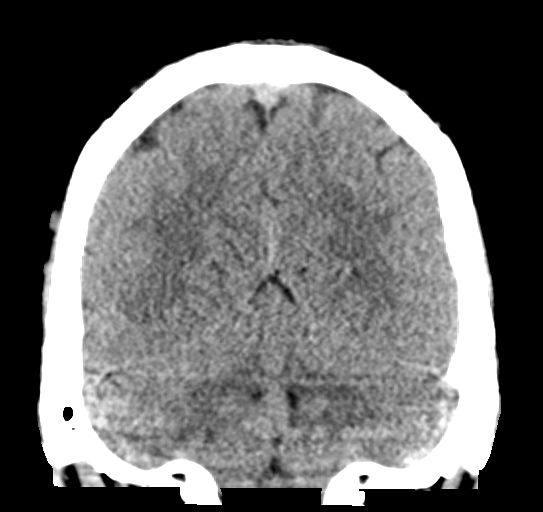
[im 33/74  brain]
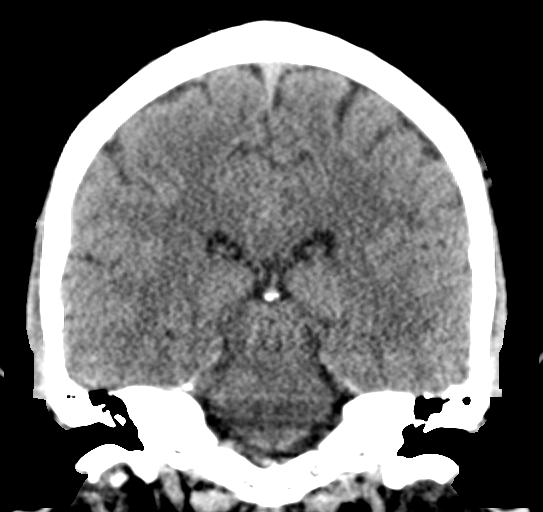
[im 41/74  brain]
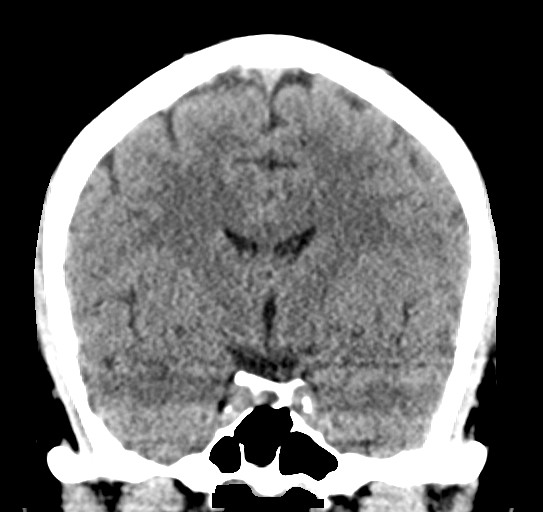

[Series 5: sagittal soft tissue · sagittal · 0.29mm/px · 3 of 57 slices shown]
[im 19/57  brain]
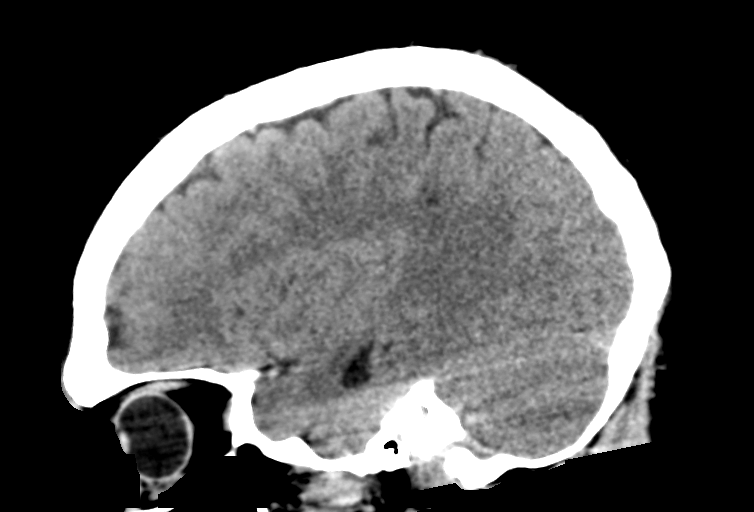
[im 29/57  brain]
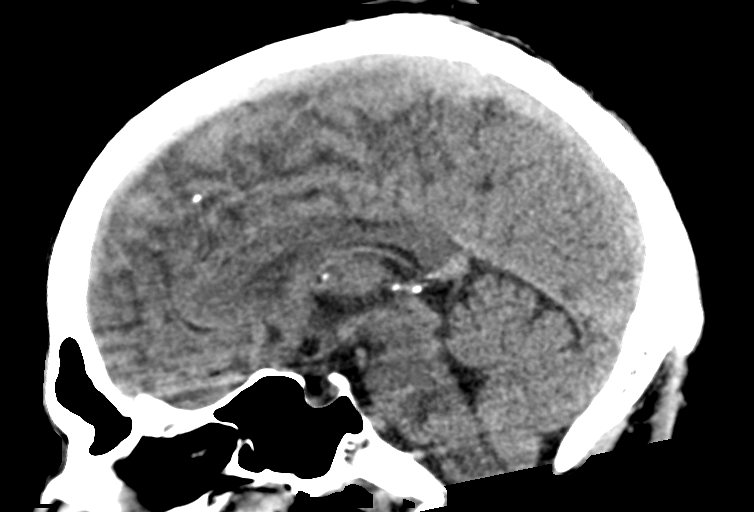
[im 38/57  brain]
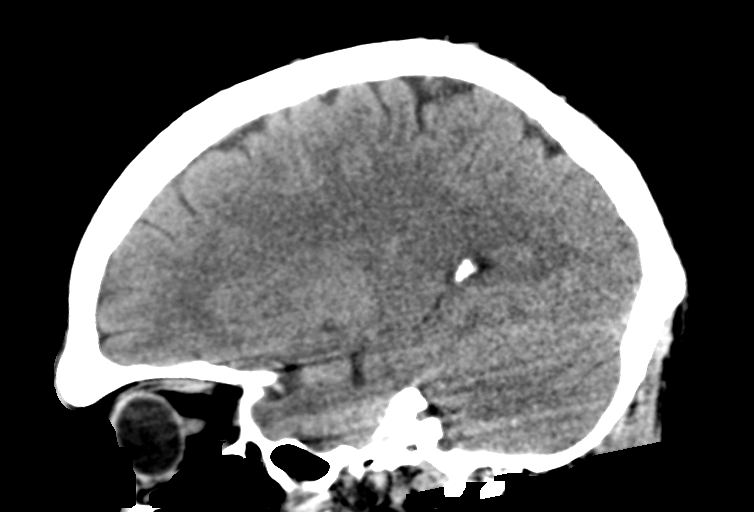

[Series 6: head wo · axial · 0.43mm/px · z∈[+283,+393]mm · 7 of 30 slices shown, 9 images]
[im 4/30  brain]
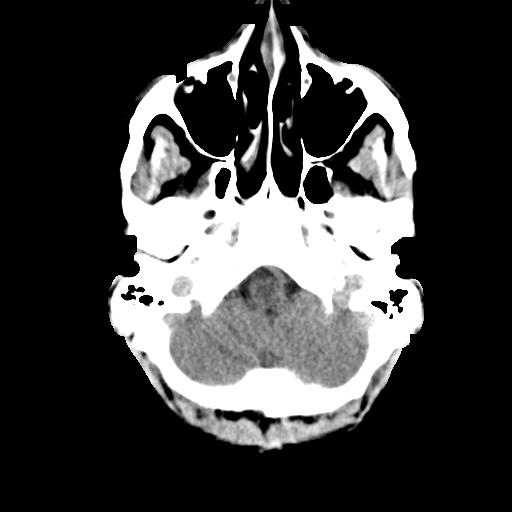
[im 4/30  bone]
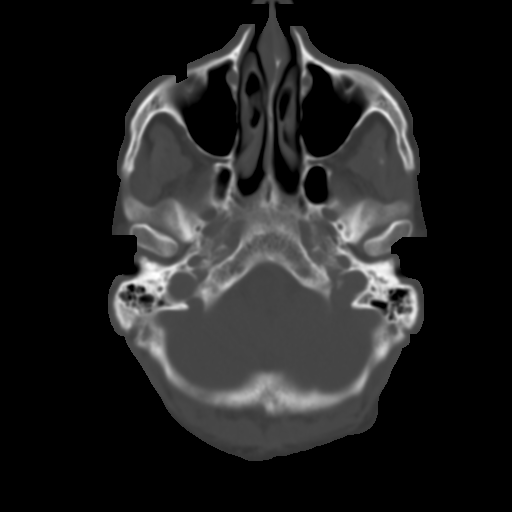
[im 8/30  brain]
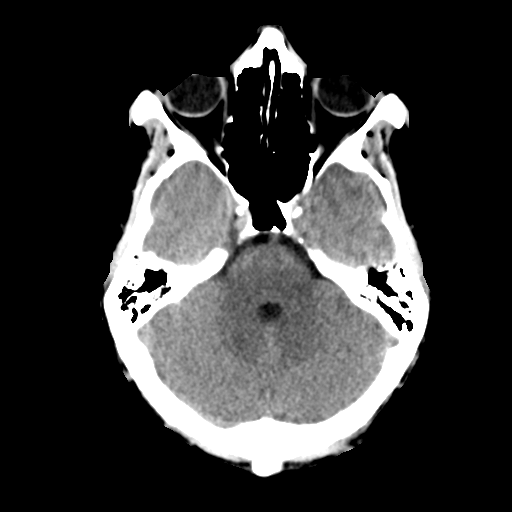
[im 11/30  brain]
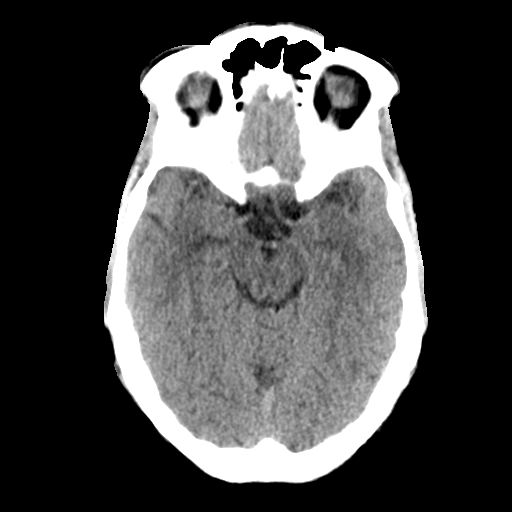
[im 15/30  brain]
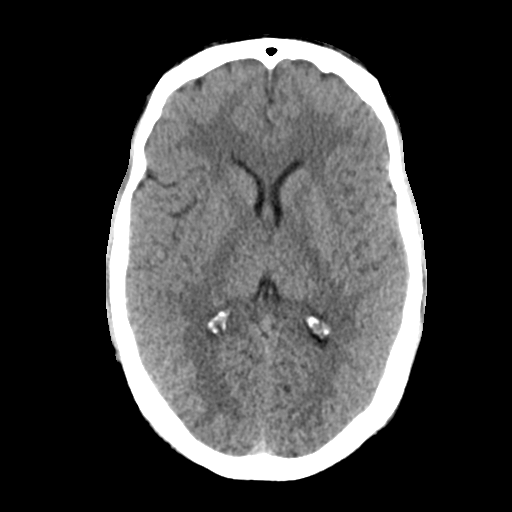
[im 19/30  brain]
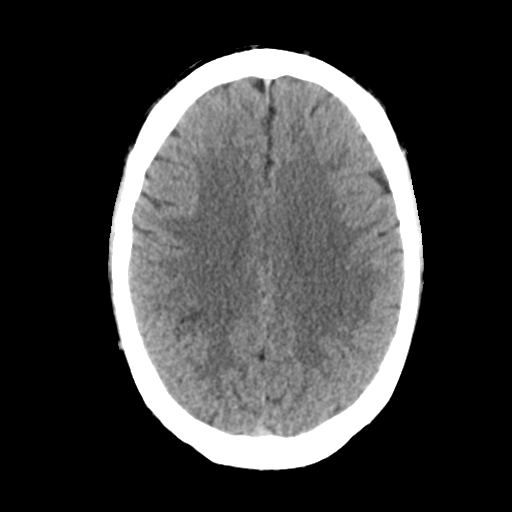
[im 19/30  bone]
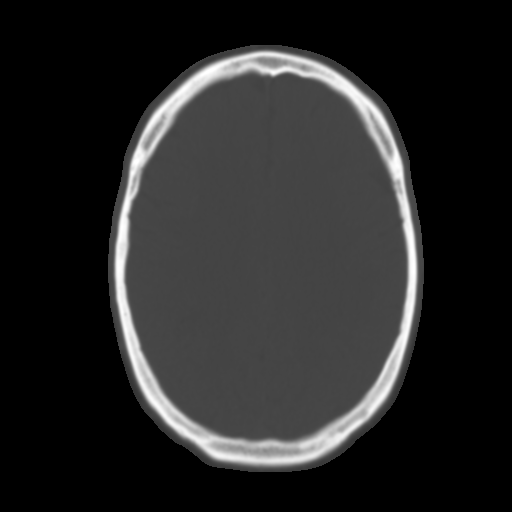
[im 22/30  brain]
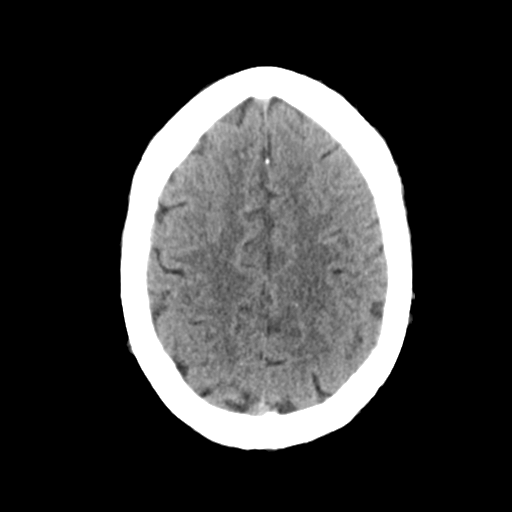
[im 26/30  brain]
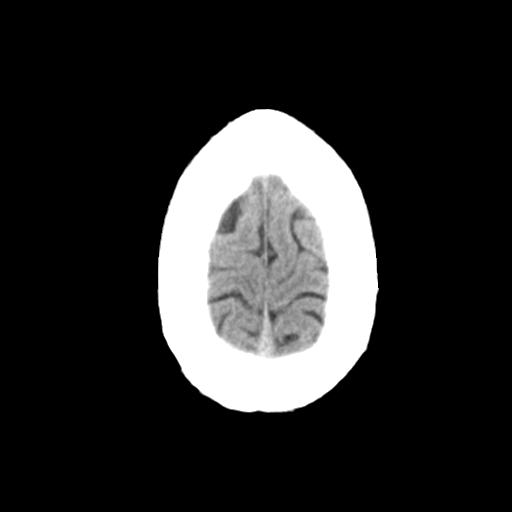

[16 of 47 positions shown; findings below may reference images not displayed]

FINDINGS: Brain: No evidence of acute large vascular territory infarction,
hemorrhage, hydrocephalus, extra-axial collection or mass
lesion/mass effect. Mild patchy white matter hypoattenuation,
nonspecific but most likely related to chronic microvascular
ischemic disease. Small area of hypoattenuation in the inferior
right basal ganglia is similar to prior.

Vascular: No hyperdense vessel identified.

Skull: No acute fracture.

Sinuses/Orbits: Visualized sinuses are clear.  Unremarkable orbits.

Other: No mastoid effusions.

ASPECTS ([facility] Stroke Program Early CT Score) Total score (0-10
with 10 being normal): 10.
IMPRESSION: 1. No evidence of acute intracranial abnormality. ASPECTS is 10.
2. Mild scattered white matter hypoattenuation, nonspecific but most
likely related to chronic microvascular ischemic disease.
3. Small area of hypoattenuation in the inferior right basal ganglia
is similar to prior and may represent a dilated perivascular space
(favored) versus prior lacunar infarct.

Code stroke imaging results were communicated on 01/05/2021 at [DATE] to provider Dr. Dear Via telephone, who verbally acknowledged
these results.

## 2022-07-15 DIAGNOSIS — R509 Fever, unspecified: Secondary | ICD-10-CM | POA: Diagnosis not present

## 2022-07-15 DIAGNOSIS — I959 Hypotension, unspecified: Secondary | ICD-10-CM | POA: Diagnosis not present

## 2022-07-15 DIAGNOSIS — R531 Weakness: Secondary | ICD-10-CM | POA: Diagnosis not present

## 2022-07-15 DIAGNOSIS — R000 Tachycardia, unspecified: Secondary | ICD-10-CM | POA: Diagnosis not present

## 2022-07-18 DIAGNOSIS — I959 Hypotension, unspecified: Secondary | ICD-10-CM | POA: Diagnosis not present

## 2022-07-18 DIAGNOSIS — R112 Nausea with vomiting, unspecified: Secondary | ICD-10-CM | POA: Diagnosis not present

## 2022-07-21 DIAGNOSIS — F17210 Nicotine dependence, cigarettes, uncomplicated: Secondary | ICD-10-CM | POA: Diagnosis not present

## 2022-07-21 DIAGNOSIS — Z6827 Body mass index (BMI) 27.0-27.9, adult: Secondary | ICD-10-CM | POA: Diagnosis not present

## 2022-07-21 DIAGNOSIS — G43109 Migraine with aura, not intractable, without status migrainosus: Secondary | ICD-10-CM | POA: Diagnosis not present

## 2022-07-26 ENCOUNTER — Telehealth: Payer: Self-pay

## 2022-07-26 ENCOUNTER — Other Ambulatory Visit: Payer: Self-pay

## 2022-07-26 ENCOUNTER — Ambulatory Visit: Payer: BC Managed Care – PPO | Admitting: Nurse Practitioner

## 2022-07-26 ENCOUNTER — Encounter: Payer: Self-pay | Admitting: Nurse Practitioner

## 2022-07-26 VITALS — BP 124/82 | HR 63 | Temp 96.7°F | Ht 73.0 in | Wt 210.8 lb

## 2022-07-26 DIAGNOSIS — Z9889 Other specified postprocedural states: Secondary | ICD-10-CM

## 2022-07-26 DIAGNOSIS — Z0000 Encounter for general adult medical examination without abnormal findings: Secondary | ICD-10-CM

## 2022-07-26 DIAGNOSIS — R5383 Other fatigue: Secondary | ICD-10-CM

## 2022-07-26 DIAGNOSIS — I8393 Asymptomatic varicose veins of bilateral lower extremities: Secondary | ICD-10-CM | POA: Diagnosis not present

## 2022-07-26 DIAGNOSIS — Z1211 Encounter for screening for malignant neoplasm of colon: Secondary | ICD-10-CM | POA: Insufficient documentation

## 2022-07-26 LAB — COMPREHENSIVE METABOLIC PANEL
ALT: 20 U/L (ref 0–53)
AST: 13 U/L (ref 0–37)
Albumin: 4.4 g/dL (ref 3.5–5.2)
Alkaline Phosphatase: 68 U/L (ref 39–117)
BUN: 14 mg/dL (ref 6–23)
CO2: 29 mEq/L (ref 19–32)
Calcium: 9.9 mg/dL (ref 8.4–10.5)
Chloride: 103 mEq/L (ref 96–112)
Creatinine, Ser: 1.04 mg/dL (ref 0.40–1.50)
GFR: 86.8 mL/min (ref >60.00)
Glucose, Bld: 89 mg/dL (ref 70–99)
Potassium: 4.7 mEq/L (ref 3.5–5.1)
Sodium: 139 mEq/L (ref 135–145)
Total Bilirubin: 0.5 mg/dL (ref 0.2–1.2)
Total Protein: 6.3 g/dL (ref 6.0–8.3)

## 2022-07-26 LAB — CBC
HCT: 41.1 % (ref 39.0–52.0)
Hemoglobin: 14 g/dL (ref 13.0–17.0)
MCHC: 34.2 g/dL (ref 30.0–36.0)
MCV: 94.6 fl (ref 78.0–100.0)
Platelets: 268 10*3/uL (ref 150.0–400.0)
RBC: 4.35 Mil/uL (ref 4.22–5.81)
RDW: 12.9 % (ref 11.5–15.5)
WBC: 8.1 10*3/uL (ref 4.0–10.5)

## 2022-07-26 LAB — VITAMIN B12: Vitamin B-12: 259 pg/mL (ref 211–911)

## 2022-07-26 LAB — TSH: TSH: 1.66 u[IU]/mL (ref 0.35–5.50)

## 2022-07-26 MED ORDER — CYCLOBENZAPRINE HCL 10 MG PO TABS: 10.0000 mg | ORAL_TABLET | Freq: Three times a day (TID) | ORAL | 0 refills | Status: DC | PRN

## 2022-07-26 MED ORDER — NA SULFATE-K SULFATE-MG SULF 17.5-3.13-1.6 GM/177ML PO SOLN: 1.0000 | Freq: Once | ORAL | 0 refills | Status: AC

## 2022-07-26 NOTE — Progress Notes (Signed)
New Patient Office Visit  Subjective    Patient ID: Parker Gomez, male    DOB: Oct 30, 1976  Age: 46 y.o. MRN: [id number]  CC:  Chief Complaint  Patient presents with   New Patient (Initial Visit)    New patient, est care , discuss viscose vein , feeling tried       HPI Parker Gomez presents to establish care   Fatigue: States that over the past. States after his surgery. States that he feels more tired over the past year. States that he does work 10 hours a day.  States that he recently got ten hours maybe one nigthtime awakening. States that he has been on zoloft in the past.  Does get up at 330am to get to work at [employer].  Varicose veins: since about 46 years old. They are on bilateral upper thighs. States that they do bother him sometimes. States that he has not been to vascular surgery.  Migriane: has a history migriane that thought it was stress induced. States that he quit that job and it resolved. States he had one last week but he stopped smoking.  The last 1 he had before he left his job presented with inability to get his words out.  Patient states he was able to think of what he wanted to say just not cannot articulate.  Was rushed to the hospital cleared for stroke and headache came on approximate 30 minutes after.  Colonscopy: Ambulatory referral [facility]: dad had enlarged prostate. No prostate ca.  Too Parker, currently average risk TDAP: 2021, up-to-date Covid vaccine:  not up to date, patient refused Flu shot: declined     07/26/2022   10:30 AM  PHQ9 SCORE ONLY  PHQ-9 Total Score 8      Outpatient Encounter Medications as of 07/26/2022  Medication Sig   cyclobenzaprine (FLEXERIL) 10 MG tablet Take 1 tablet (10 mg total) by mouth 3 (three) times daily as needed for muscle spasms.   pantoprazole (PROTONIX) 40 MG tablet Take 1 tablet by mouth daily.   [DISCONTINUED] cyclobenzaprine (FLEXERIL) 10 MG tablet Take 10 mg by mouth 3 (three) times daily as  needed for muscle spasms.   acetaminophen (TYLENOL) 325 MG tablet Take 650 mg by mouth every 6 (six) hours as needed for mild pain. (Patient not taking: Reported on 07/26/2022)   albuterol (VENTOLIN HFA) 108 (90 Base) MCG/ACT inhaler Inhale 2 puffs into the lungs every 6 (six) hours as needed for wheezing or shortness of breath. (Patient not taking: Reported on 07/26/2022)   meloxicam (MOBIC) 15 MG tablet Take 1 tablet by mouth daily. (Patient not taking: Reported on 07/26/2022)   [DISCONTINUED] cephALEXin (KEFLEX) 500 MG capsule Take 1 capsule (500 mg total) by mouth 4 (four) times daily.   No facility-administered encounter medications on file as of 07/26/2022.    Past Medical History:  Diagnosis Date   Spinal stenosis     Past Surgical History:  Procedure Laterality Date   BACK SURGERY     CERVICAL SPINE SURGERY  2020    Family History  Problem Relation Age of Onset   COPD Father    Frontotemporal dementia Father    Lung cancer Maternal Grandmother    Throat cancer Maternal Grandfather    Ovarian cancer Paternal Grandmother    Dementia Paternal Grandfather    Heart attack Paternal Grandfather     Social History   Socioeconomic History   Marital status: Married    Spouse name: Not on file  Number of children: 2   Years of education: Not on file   Highest education level: Not on file  Occupational History   Not on file  Tobacco Use   Smoking status: Former    Packs/day: 1.00    Years: 30.00    Total pack years: 30.00    Types: Cigarettes    Quit date: 07/12/2022    Years since quitting: 0.0   Smokeless tobacco: Never  Vaping Use   Vaping Use: Some days  Substance and Sexual Activity   Alcohol use: Yes    Comment: occasionally, stop since smoking. Once a month   Drug use: No   Sexual activity: Not on file  Other Topics Concern   Not on file  Social History Narrative   1 step and 1 bio      Lindie Spruce fulltimes   Social Determinants of Health   Financial Resource  Strain: Not on file  Food Insecurity: Not on file  Transportation Needs: Not on file  Physical Activity: Not on file  Stress: Not on file  Social Connections: Not on file  Intimate Partner Violence: Not on file    Review of Systems  Constitutional:  Positive for malaise/fatigue. Negative for chills and fever.  Respiratory:  Negative for shortness of breath.   Cardiovascular:  Negative for chest pain.  Neurological:  Positive for headaches.        Objective    BP 124/82   Pulse 63   Temp (!) 96.7 F (35.9 C)   Ht 6\' 1"  (1.854 m)   Wt 210 lb 12.8 oz (95.6 kg)   SpO2 98%   BMI 27.81 kg/m   Physical Exam Vitals and nursing note reviewed.  Constitutional:      Appearance: Normal appearance.  HENT:     Right Ear: Tympanic membrane, ear canal and external ear normal.     Left Ear: Tympanic membrane, ear canal and external ear normal.  Eyes:     Extraocular Movements: Extraocular movements intact.     Pupils: Pupils are equal, round, and reactive to light.     Comments: Wears glasses   Cardiovascular:     Rate and Rhythm: Normal rate and regular rhythm.     Pulses: Normal pulses.     Heart sounds: Normal heart sounds.  Pulmonary:     Effort: Pulmonary effort is normal.     Breath sounds: Normal breath sounds.  Musculoskeletal:     Right lower leg: No edema.     Left lower leg: No edema.  Lymphadenopathy:     Cervical: No cervical adenopathy.  Skin:    General: Skin is warm.          Comments: Varicose veins   Neurological:     General: No focal deficit present.     Mental Status: He is alert.     Deep Tendon Reflexes:     Reflex Scores:      Bicep reflexes are 2+ on the right side and 2+ on the left side.      Patellar reflexes are 2+ on the right side and 2+ on the left side.    Comments: Bilateral upper and lower extremity strength 5/5  Psychiatric:        Mood and Affect: Mood normal.        Behavior: Behavior normal.        Thought Content: Thought  content normal.        Judgment: Judgment normal.  Assessment & Plan:   Problem List Items Addressed This Visit       Cardiovascular and Mediastinum   Asymptomatic varicose veins of both lower extremities    Patient complains of bilateral lower extremity varicose veins.  States that there is a 620.  Not causing any trouble right now.  Patient will still do watchful waiting.  If he changes his mind will refer to vascular surgery for further work-up and evaluation.        Other   Encounter for medical examination to establish care - Primary    Unable to review outside medical records.  Record release signed pending records.  Patient was in member at [facility] medical      Relevant Orders   CBC   Comprehensive metabolic panel   History of lumbar surgery    Patient will use Flexeril 10 mg 3 times daily as needed as needed.  We will continue medication      Relevant Medications   cyclobenzaprine (FLEXERIL) 10 MG tablet   History of cervical spinal surgery    Patient will use Flexeril 10 mg 3 times daily as needed as needed.  We will continue medication      Relevant Medications   cyclobenzaprine (FLEXERIL) 10 MG tablet   Other fatigue    Ambiguous in nature.  We will check metabolic labs to rule out organic cause.  Did offer to check patient's testosterone at a later date he politely declined.  States he has been evaluated for sleep apnea and had a touch of it.  Not currently on a CPAP.  Also borderline screening in regards to depression.  If metabolic labs come back normal we can consider starting patient on duloxetine 30 mg nightly.  If that does not improve patient will need to be reevaluated in regards to sleep study and possible CPAP machine      Relevant Orders   CBC   Comprehensive metabolic panel   Vitamin B12   TSH   Other Visit Diagnoses     Screening for colon cancer       Relevant Orders   Ambulatory referral to Gastroenterology       Return in  about 6 months (around 01/24/2023) for CPE and labs.   Audria Nine, NP

## 2022-07-26 NOTE — Assessment & Plan Note (Signed)
Patient will use Flexeril 10 mg 3 times daily as needed as needed.  We will continue medication

## 2022-07-26 NOTE — Patient Instructions (Addendum)
Nice to see you today I will be in touch with the labs Follow up in 6 months with me for a physical, sooner if you need me

## 2022-07-26 NOTE — Assessment & Plan Note (Signed)
Patient will use Flexeril 10 mg 3 times daily as needed as needed.  We will continue medication 

## 2022-07-26 NOTE — Telephone Encounter (Signed)
Gastroenterology Pre-Procedure Review  Request Date: 08/25/22 Requesting Physician: Dr. Allegra Lai  PATIENT REVIEW QUESTIONS: The patient responded to the following health history questions as indicated:    1. Are you having any GI issues? no 2. Do you have a personal history of Polyps? Yes sister and father colon polyps 3. Do you have a family history of Colon Cancer or Polyps? no 4. Diabetes Mellitus? no 5. Joint replacements in the past 12 months?no 6. Major health problems in the past 3 months?no 7. Any artificial heart valves, MVP, or defibrillator?no    MEDICATIONS & ALLERGIES:    Patient reports the following regarding taking any anticoagulation/antiplatelet therapy:   Plavix, Coumadin, Eliquis, Xarelto, Lovenox, Pradaxa, Brilinta, or Effient? no Aspirin? no  Patient confirms/reports the following medications:  Current Outpatient Medications  Medication Sig Dispense Refill   acetaminophen (TYLENOL) 325 MG tablet Take 650 mg by mouth every 6 (six) hours as needed for mild pain. (Patient not taking: Reported on 07/26/2022)     albuterol (VENTOLIN HFA) 108 (90 Base) MCG/ACT inhaler Inhale 2 puffs into the lungs every 6 (six) hours as needed for wheezing or shortness of breath. (Patient not taking: Reported on 07/26/2022) 18 g 0   cyclobenzaprine (FLEXERIL) 10 MG tablet Take 10 mg by mouth 3 (three) times daily as needed for muscle spasms.     meloxicam (MOBIC) 15 MG tablet Take 1 tablet by mouth daily. (Patient not taking: Reported on 07/26/2022)     pantoprazole (PROTONIX) 40 MG tablet Take 1 tablet by mouth daily.     No current facility-administered medications for this visit.    Patient confirms/reports the following allergies:  No Known Allergies  No orders of the defined types were placed in this encounter.   AUTHORIZATION INFORMATION Primary Insurance: 1D#: Group #:  Secondary Insurance: 1D#: Group #:  SCHEDULE INFORMATION: Date: 08/25/22 Time: Location: [facility]

## 2022-07-26 NOTE — Assessment & Plan Note (Signed)
Ambiguous in nature.  We will check metabolic labs to rule out organic cause.  Did offer to check patient's testosterone at a later date he politely declined.  States he has been evaluated for sleep apnea and had a touch of it.  Not currently on a CPAP.  Also borderline screening in regards to depression.  If metabolic labs come back normal we can consider starting patient on duloxetine 30 mg nightly.  If that does not improve patient will need to be reevaluated in regards to sleep study and possible CPAP machine

## 2022-07-26 NOTE — Assessment & Plan Note (Signed)
Unable to review outside medical records.  Record release signed pending records.  Patient was in member at [facility]

## 2022-07-26 NOTE — Assessment & Plan Note (Signed)
Patient complains of bilateral lower extremity varicose veins.  States that there is a 620.  Not causing any trouble right now.  Patient will still do watchful waiting.  If he changes his mind will refer to vascular surgery for further work-up and evaluation.

## 2022-08-08 ENCOUNTER — Ambulatory Visit: Payer: Self-pay | Admitting: Family Medicine

## 2022-08-25 ENCOUNTER — Ambulatory Visit: Payer: BC Managed Care – PPO | Admitting: Anesthesiology

## 2022-08-25 ENCOUNTER — Encounter
Admission: RE | Disposition: A | Discharge status: Home / Self Care | Payer: Self-pay | Source: Home / Self Care | Attending: Gastroenterology

## 2022-08-25 ENCOUNTER — Ambulatory Visit
Admission: RE | Admit: 2022-08-25 | Discharge: 2022-08-25 | Disposition: A | Discharge status: Home / Self Care | Payer: BC Managed Care – PPO | Attending: Gastroenterology | Admitting: Gastroenterology

## 2022-08-25 ENCOUNTER — Encounter: Payer: Self-pay | Admitting: Gastroenterology

## 2022-08-25 DIAGNOSIS — Z1211 Encounter for screening for malignant neoplasm of colon: Secondary | ICD-10-CM

## 2022-08-25 DIAGNOSIS — Z87891 Personal history of nicotine dependence: Secondary | ICD-10-CM | POA: Insufficient documentation

## 2022-08-25 DIAGNOSIS — K644 Residual hemorrhoidal skin tags: Secondary | ICD-10-CM | POA: Diagnosis not present

## 2022-08-25 DIAGNOSIS — Z0000 Encounter for general adult medical examination without abnormal findings: Secondary | ICD-10-CM

## 2022-08-25 DIAGNOSIS — J449 Chronic obstructive pulmonary disease, unspecified: Secondary | ICD-10-CM | POA: Insufficient documentation

## 2022-08-25 DIAGNOSIS — F17210 Nicotine dependence, cigarettes, uncomplicated: Secondary | ICD-10-CM | POA: Diagnosis not present

## 2022-08-25 HISTORY — PX: COLONOSCOPY WITH PROPOFOL: SHX5780

## 2022-08-25 SURGERY — COLONOSCOPY WITH PROPOFOL
Anesthesia: General

## 2022-08-25 MED ORDER — SODIUM CHLORIDE 0.9 % IV SOLN: INTRAVENOUS | Status: DC

## 2022-08-25 MED ORDER — PROPOFOL 10 MG/ML IV BOLUS
INTRAVENOUS | Status: DC | PRN
  Administered 2022-08-25 (×7): 40 mg via INTRAVENOUS
  Administered 2022-08-25: 120 mg via INTRAVENOUS

## 2022-08-25 NOTE — Anesthesia Postprocedure Evaluation (Signed)
Anesthesia Post Note  Patient: Parker Gomez  Procedure(s) Performed: COLONOSCOPY WITH PROPOFOL  Patient location during evaluation: PACU Anesthesia Type: General Level of consciousness: awake and oriented Pain management: satisfactory to patient Vital Signs Assessment: post-procedure vital signs reviewed and stable Respiratory status: spontaneous breathing and nonlabored ventilation Cardiovascular status: stable Anesthetic complications: no   No notable events documented.   Last Vitals:  Vitals:   08/25/22 0926 08/25/22 0927  BP: 93/73 103/72  Pulse: 61   Resp: 16   Temp:    SpO2: 100%     Last Pain:  Vitals:   08/25/22 0926  TempSrc:   PainSc: 0-No pain                 VAN STAVEREN,Ophelia Sipe

## 2022-08-25 NOTE — Anesthesia Preprocedure Evaluation (Signed)
Anesthesia Evaluation  Patient identified by MRN, date of birth, ID band Patient awake    Reviewed: Allergy & Precautions, NPO status , Patient's Chart, lab work & pertinent test results  Airway Mallampati: II  TM Distance: >3 FB Neck ROM: Full    Dental  (+) Teeth Intact, Caps,    Pulmonary neg pulmonary ROS, COPD, Patient abstained from smoking., former smoker,    Pulmonary exam normal  + decreased breath sounds      Cardiovascular Exercise Tolerance: Good negative cardio ROS Normal cardiovascular exam Rhythm:Regular Rate:Normal     Neuro/Psych negative neurological ROS  negative psych ROS   GI/Hepatic negative GI ROS, Neg liver ROS,   Endo/Other  negative endocrine ROS  Renal/GU negative Renal ROS  negative genitourinary   Musculoskeletal negative musculoskeletal ROS (+)   Abdominal Normal abdominal exam  (+)   Peds negative pediatric ROS (+)  Hematology negative hematology ROS (+)   Anesthesia Other Findings Past Medical History: No date: Spinal stenosis  Past Surgical History: No date: BACK SURGERY 2020: CERVICAL SPINE SURGERY  BMI    Body Mass Index: 28.37 kg/m      Reproductive/Obstetrics negative OB ROS                             Anesthesia Physical Anesthesia Plan  ASA: 2  Anesthesia Plan: General   Post-op Pain Management:    Induction: Intravenous  PONV Risk Score and Plan: Propofol infusion and TIVA  Airway Management Planned: Natural Airway  Additional Equipment:   Intra-op Plan:   Post-operative Plan:   Informed Consent: I have reviewed the patients History and Physical, chart, labs and discussed the procedure including the risks, benefits and alternatives for the proposed anesthesia with the patient or authorized representative who has indicated his/her understanding and acceptance.     Dental Advisory Given  Plan Discussed with: CRNA and  Surgeon  Anesthesia Plan Comments:         Anesthesia Quick Evaluation

## 2022-08-25 NOTE — H&P (Signed)
Cephas Darby, MD [address]  [place]  [place], [place] [address]  Main: [phone]  Fax: [phone] Pager: [phone]  Primary Care Physician:  Michela Pitcher, NP Primary Gastroenterologist:  Dr. Cephas Darby  Pre-Procedure History & Physical: HPI:  Parker Gomez is a 46 y.o. male is here for an colonoscopy.   Past Medical History:  Diagnosis Date   Spinal stenosis     Past Surgical History:  Procedure Laterality Date   BACK SURGERY     CERVICAL SPINE SURGERY  2020    Prior to Admission medications   Medication Sig Start Date End Date Taking? Authorizing Provider  pantoprazole (PROTONIX) 40 MG tablet Take 1 tablet by mouth daily. 03/16/18  Yes [provider]  acetaminophen (TYLENOL) 325 MG tablet Take 650 mg by mouth every 6 (six) hours as needed for mild pain. Patient not taking: Reported on 07/26/2022    [provider]  albuterol (VENTOLIN HFA) 108 (90 Base) MCG/ACT inhaler Inhale 2 puffs into the lungs every 6 (six) hours as needed for wheezing or shortness of breath. Patient not taking: Reported on 07/26/2022 11/23/20   Scot Jun, FNP  cyclobenzaprine (FLEXERIL) 10 MG tablet Take 1 tablet (10 mg total) by mouth 3 (three) times daily as needed for muscle spasms. 07/26/22   Michela Pitcher, NP  meloxicam (MOBIC) 15 MG tablet Take 1 tablet by mouth daily. Patient not taking: Reported on 07/26/2022 05/31/18   [provider]    Allergies as of 07/26/2022   (No Known Allergies)    Family History  Problem Relation Age of Onset   COPD Father    Frontotemporal dementia Father    Lung cancer Maternal Grandmother    Throat cancer Maternal Grandfather    Ovarian cancer Paternal Grandmother    Dementia Paternal Grandfather    Heart attack Paternal Grandfather     Social History   Socioeconomic History   Marital status: Married    Spouse name: Not on file   Number of children: 2   Years of education: Not on file   Highest  education level: Not on file  Occupational History   Not on file  Tobacco Use   Smoking status: Former    Packs/day: 1.00    Years: 30.00    Total pack years: 30.00    Types: Cigarettes    Quit date: 07/12/2022    Years since quitting: 0.1   Smokeless tobacco: Never  Vaping Use   Vaping Use: Some days  Substance and Sexual Activity   Alcohol use: Yes    Comment: occasionally, stop since smoking. Once a month   Drug use: No   Sexual activity: Not on file  Other Topics Concern   Not on file  Social History Narrative   1 step and 1 bio      Pershing Proud fulltimes   Social Determinants of Health   Financial Resource Strain: Not on file  Food Insecurity: Not on file  Transportation Needs: Not on file  Physical Activity: Not on file  Stress: Not on file  Social Connections: Not on file  Intimate Partner Violence: Not on file    Review of Systems: See HPI, otherwise negative ROS  Physical Exam: BP 93/79   Pulse 84   Temp (!) 97.1 F (36.2 C) (Temporal)   Resp 18   Ht 6\' 1"  (1.854 m)   Wt 97.5 kg   SpO2 100%   BMI 28.37 kg/m  General:  Alert,  pleasant and cooperative in NAD Head:  Normocephalic and atraumatic. Neck:  Supple; no masses or thyromegaly. Lungs:  Clear throughout to auscultation.    Heart:  Regular rate and rhythm. Abdomen:  Soft, nontender and nondistended. Normal bowel sounds, without guarding, and without rebound.   Neurologic:  Alert and  oriented x4;  grossly normal neurologically.  Impression/Plan: Davan Nawabi is here for an colonoscopy to be performed for colon cancer screening  Risks, benefits, limitations, and alternatives regarding  colonoscopy have been reviewed with the patient.  Questions have been answered.  All parties agreeable.   Lannette Donath, MD  08/25/2022, 7:53 AM

## 2022-08-25 NOTE — Transfer of Care (Signed)
Immediate Anesthesia Transfer of Care Note  Patient: Parker Gomez  Procedure(s) Performed: COLONOSCOPY WITH PROPOFOL  Patient Location: Endoscopy Unit  Anesthesia Type:General  Level of Consciousness: drowsy  Airway & Oxygen Therapy: Patient Spontanous Breathing and Patient connected to nasal cannula oxygen  Post-op Assessment: Report given to RN, Post -op Vital signs reviewed and stable and Patient moving all extremities  Post vital signs: Reviewed and stable  Last Vitals:  Vitals Value Taken Time  BP 88/61 08/25/22 0907  Temp 36.1 C 08/25/22 0906  Pulse 66 08/25/22 0908  Resp 19 08/25/22 0908  SpO2 100 % 08/25/22 0908  Vitals shown include unvalidated device data.  Last Pain:  Vitals:   08/25/22 0906  TempSrc: Tympanic  PainSc: 0-No pain         Complications: No notable events documented.

## 2022-08-25 NOTE — Op Note (Signed)
[facility] Gastroenterology Patient Name: Parker Gomez Procedure Date: 08/25/2022 8:36 AM MRN: [id number] Account #: [id number] Date of Birth: May 26, 1976 Admit Type: Outpatient Age: 46 Room: [facility] ENDO ROOM 4 Gender: Male Note Status: Finalized Instrument Name: Prentice Docker [id number] Procedure:             Colonoscopy Indications:           Screening for colorectal malignant neoplasm, This is                         the patient's first colonoscopy Providers:             Toney Reil MD, MD Referring MD:          Genene Churn. Toney Reil (Referring MD) Medicines:             General Anesthesia Complications:         No immediate complications. Estimated blood loss: None. Procedure:             Pre-Anesthesia Assessment:                        - Prior to the procedure, a History and Physical was                         performed, and patient medications and allergies were                         reviewed. The patient is competent. The risks and                         benefits of the procedure and the sedation options and                         risks were discussed with the patient. All questions                         were answered and informed consent was obtained.                         Patient identification and proposed procedure were                         verified by the physician, the nurse, the                         anesthesiologist, the anesthetist and the technician                         in the pre-procedure area in the procedure room in the                         endoscopy suite. Mental Status Examination: alert and                         oriented. Airway Examination: normal oropharyngeal                         airway and neck mobility. Respiratory Examination:  clear to auscultation. CV Examination: normal.                         Prophylactic Antibiotics: The patient does not require                         prophylactic  antibiotics. Prior Anticoagulants: The                         patient has taken no previous anticoagulant or                         antiplatelet agents. ASA Grade Assessment: II - A                         patient with mild systemic disease. After reviewing                         the risks and benefits, the patient was deemed in                         satisfactory condition to undergo the procedure. The                         anesthesia plan was to use general anesthesia.                         Immediately prior to administration of medications,                         the patient was re-assessed for adequacy to receive                         sedatives. The heart rate, respiratory rate, oxygen                         saturations, blood pressure, adequacy of pulmonary                         ventilation, and response to care were monitored                         throughout the procedure. The physical status of the                         patient was re-assessed after the procedure.                        After obtaining informed consent, the colonoscope was                         passed under direct vision. Throughout the procedure,                         the patient's blood pressure, pulse, and oxygen                         saturations were monitored continuously. The  Colonoscope was introduced through the anus and                         advanced to the the terminal ileum, with                         identification of the appendiceal orifice and IC                         valve. The colonoscopy was performed without                         difficulty. The patient tolerated the procedure well.                         The quality of the bowel preparation was evaluated                         using the BBPS [facility] Bowel Preparation Scale) with                         scores of: Right Colon = 3, Transverse Colon = 3 and                         Left Colon =  3 (entire mucosa seen well with no                         residual staining, small fragments of stool or opaque                         liquid). The total BBPS score equals 9. Findings:      The perianal and digital rectal examinations were normal. Pertinent       negatives include normal sphincter tone and no palpable rectal lesions.      The terminal ileum appeared normal.      The entire examined colon appeared normal.      Non-bleeding external hemorrhoids were found during retroflexion. The       hemorrhoids were medium-sized. Impression:            - The examined portion of the ileum was normal.                        - The entire examined colon is normal.                        - Non-bleeding external hemorrhoids.                        - No specimens collected. Recommendation:        - Discharge patient to home (with escort).                        - Resume previous diet today.                        - Continue present medications.                        -  Repeat colonoscopy in 10 years for screening                         purposes. Procedure Code(s):     --- Professional ---                        J8563, Colorectal cancer screening; colonoscopy on                         individual not meeting criteria for high risk Diagnosis Code(s):     --- Professional ---                        Z12.11, Encounter for screening for malignant neoplasm                         of colon                        K64.4, Residual hemorrhoidal skin tags CPT copyright 2019 American Medical Association. All rights reserved. The codes documented in this report are preliminary and upon coder review may  be revised to meet current compliance requirements. Dr. Libby Maw Toney Reil MD, MD 08/25/2022 9:05:06 AM This report has been signed electronically. Number of Addenda: 0 Note Initiated On: 08/25/2022 8:36 AM Scope Withdrawal Time: 0 hours 9 minutes 10 seconds  Total Procedure Duration: 0  hours 16 minutes 45 seconds  Estimated Blood Loss:  Estimated blood loss: none.      [facility]

## 2022-08-26 ENCOUNTER — Encounter: Payer: Self-pay | Admitting: Gastroenterology

## 2022-10-18 DIAGNOSIS — R0689 Other abnormalities of breathing: Secondary | ICD-10-CM | POA: Diagnosis not present

## 2022-10-18 DIAGNOSIS — R609 Edema, unspecified: Secondary | ICD-10-CM | POA: Diagnosis not present

## 2022-10-25 DIAGNOSIS — R109 Unspecified abdominal pain: Secondary | ICD-10-CM | POA: Diagnosis not present

## 2022-10-25 DIAGNOSIS — M62830 Muscle spasm of back: Secondary | ICD-10-CM | POA: Diagnosis not present

## 2022-10-26 ENCOUNTER — Ambulatory Visit: Payer: BC Managed Care – PPO | Admitting: Nurse Practitioner

## 2022-10-26 DIAGNOSIS — S335XXA Sprain of ligaments of lumbar spine, initial encounter: Secondary | ICD-10-CM | POA: Diagnosis not present

## 2022-11-08 DIAGNOSIS — M5136 Other intervertebral disc degeneration, lumbar region: Secondary | ICD-10-CM | POA: Diagnosis not present

## 2022-11-08 DIAGNOSIS — M545 Low back pain, unspecified: Secondary | ICD-10-CM | POA: Diagnosis not present

## 2022-11-08 DIAGNOSIS — M546 Pain in thoracic spine: Secondary | ICD-10-CM | POA: Diagnosis not present

## 2022-11-08 DIAGNOSIS — M4656 Other infective spondylopathies, lumbar region: Secondary | ICD-10-CM | POA: Diagnosis not present

## 2022-11-08 DIAGNOSIS — M549 Dorsalgia, unspecified: Secondary | ICD-10-CM | POA: Diagnosis not present

## 2022-11-08 DIAGNOSIS — M5137 Other intervertebral disc degeneration, lumbosacral region: Secondary | ICD-10-CM | POA: Diagnosis not present

## 2022-11-13 DIAGNOSIS — S93602A Unspecified sprain of left foot, initial encounter: Secondary | ICD-10-CM | POA: Diagnosis not present

## 2022-11-13 DIAGNOSIS — M79671 Pain in right foot: Secondary | ICD-10-CM | POA: Diagnosis not present

## 2022-11-15 DIAGNOSIS — Z720 Tobacco use: Secondary | ICD-10-CM | POA: Diagnosis not present

## 2022-11-15 DIAGNOSIS — M79672 Pain in left foot: Secondary | ICD-10-CM | POA: Diagnosis not present

## 2022-11-16 DIAGNOSIS — M79672 Pain in left foot: Secondary | ICD-10-CM | POA: Diagnosis not present

## 2022-11-17 DIAGNOSIS — M84375A Stress fracture, left foot, initial encounter for fracture: Secondary | ICD-10-CM | POA: Diagnosis not present

## 2022-11-24 DIAGNOSIS — M722 Plantar fascial fibromatosis: Secondary | ICD-10-CM | POA: Diagnosis not present

## 2022-11-24 DIAGNOSIS — M79672 Pain in left foot: Secondary | ICD-10-CM | POA: Diagnosis not present

## 2022-11-24 DIAGNOSIS — M25572 Pain in left ankle and joints of left foot: Secondary | ICD-10-CM | POA: Diagnosis not present

## 2022-11-24 DIAGNOSIS — S93492A Sprain of other ligament of left ankle, initial encounter: Secondary | ICD-10-CM | POA: Diagnosis not present

## 2022-12-01 DIAGNOSIS — S93492A Sprain of other ligament of left ankle, initial encounter: Secondary | ICD-10-CM | POA: Diagnosis not present

## 2022-12-02 DIAGNOSIS — M79672 Pain in left foot: Secondary | ICD-10-CM | POA: Diagnosis not present

## 2022-12-05 DIAGNOSIS — R509 Fever, unspecified: Secondary | ICD-10-CM | POA: Diagnosis not present

## 2022-12-05 DIAGNOSIS — R059 Cough, unspecified: Secondary | ICD-10-CM | POA: Diagnosis not present

## 2022-12-05 DIAGNOSIS — R1111 Vomiting without nausea: Secondary | ICD-10-CM | POA: Diagnosis not present

## 2022-12-13 DIAGNOSIS — S93492A Sprain of other ligament of left ankle, initial encounter: Secondary | ICD-10-CM | POA: Diagnosis not present

## 2022-12-13 DIAGNOSIS — M722 Plantar fascial fibromatosis: Secondary | ICD-10-CM | POA: Diagnosis not present

## 2022-12-15 DIAGNOSIS — M25572 Pain in left ankle and joints of left foot: Secondary | ICD-10-CM | POA: Diagnosis not present

## 2022-12-15 DIAGNOSIS — M79672 Pain in left foot: Secondary | ICD-10-CM | POA: Diagnosis not present

## 2023-04-06 ENCOUNTER — Ambulatory Visit: Payer: BC Managed Care – PPO | Admitting: Primary Care

## 2023-04-06 ENCOUNTER — Encounter: Payer: Self-pay | Admitting: Primary Care

## 2023-04-06 VITALS — BP 124/86 | HR 80 | Temp 98.2°F | Ht 73.0 in | Wt 216.0 lb

## 2023-04-06 DIAGNOSIS — E559 Vitamin D deficiency, unspecified: Secondary | ICD-10-CM | POA: Diagnosis not present

## 2023-04-06 DIAGNOSIS — R202 Paresthesia of skin: Secondary | ICD-10-CM

## 2023-04-06 DIAGNOSIS — M722 Plantar fascial fibromatosis: Secondary | ICD-10-CM | POA: Diagnosis not present

## 2023-04-06 LAB — VITAMIN D 25 HYDROXY (VIT D DEFICIENCY, FRACTURES): VITD: 29.28 ng/mL — ABNORMAL LOW (ref 30.00–100.00)

## 2023-04-06 LAB — HEMOGLOBIN A1C: Hgb A1c MFr Bld: 5.8 % (ref 4.6–6.5)

## 2023-04-06 LAB — VITAMIN B12: Vitamin B-12: 353 pg/mL (ref 211–911)

## 2023-04-06 LAB — MAGNESIUM: Magnesium: 1.9 mg/dL (ref 1.5–2.5)

## 2023-04-06 LAB — URIC ACID: Uric Acid, Serum: 6.1 mg/dL (ref 4.0–7.8)

## 2023-04-06 NOTE — Assessment & Plan Note (Signed)
Repeat vitamin D level pending. Currently not on treatment.

## 2023-04-06 NOTE — Patient Instructions (Signed)
Stop by the lab prior to leaving today. I will notify you of your results once received.   You will either be contacted via phone regarding your referral to podiatry, or you may receive a letter on your MyChart portal from our referral team with instructions for scheduling an appointment. Please let us know if you have not been contacted by anyone within two weeks.  It was a pleasure meeting you!

## 2023-04-06 NOTE — Progress Notes (Signed)
Subjective:    Patient ID: Parker Gomez, male    DOB: 1976/11/06, 47 y.o.   MRN: [id number]  Hip Pain  Associated symptoms include numbness.    Parker Gomez is a very pleasant 47 y.o. male patient of Matt cable, NP with a history of cervical stenosis of spine, carpal tunnel of right wrist, paresthesias, asymptomatic varicose veins of lower extremities, history of lumbar and cervical spine surgery who presents today to discuss foot pain.  He has a chronic history of bilateral foot pain since Christmas 2023. Evaluated by orthopedics through [facility] in December 2023 for atypical left foot pain.  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  He was initiated on vitamin D 50,000 IUs weekly x 6 weeks.  He underwent ABI testing in January 2024.  We do not have those records.  He underwent MRI of his left foot in late 2023 and was diagnosed with plantar fasciitis and bad ankle sprain by orthopedics.    Today he mentions ongoing foot pain to the plantar feet, mostly to the balls of the feet and to lateral and medial sides. He describes his pain as a burning and tingling. Over the last two weeks he's developed increased symptoms to his feet which are waking him during the night and disturbing him from sleeping. He's felt tired and fatigue during the day.   He has results today from his ABI's from January 2024 which were normal bilaterally. He never followed up with orthopedics as recommended.   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 He is taking Mobic once weekly, cyclobenzaprine PRN for his back, and Ibuprofen PRN.   He has not seen podiatry. He has tested negative for rheumatoid arthritis. He completed his vitamin D prescription. He had special orthotics made and wears insoles in his  shoes.   Review of Systems  Musculoskeletal:  Positive for arthralgias, back pain and myalgias.  Skin:  Negative for color change.  Neurological:  Positive for numbness.         Past Medical History:  Diagnosis Date   Spinal stenosis     Social History   Socioeconomic History   Marital status: Married    Spouse name: Not on file   Number of children: 2   Years of education: Not on file   Highest education level: Not on file  Occupational History   Not on file  Tobacco Use   Smoking status: Former    Packs/day: 1.00    Years: 30.00    Additional pack years: 0.00    Total pack years: 30.00    Types: Cigarettes    Quit date: 07/12/2022    Years since quitting: 0.7   Smokeless tobacco: Never  Vaping Use   Vaping Use: Some days  Substance and Sexual Activity   Alcohol use: Yes    Comment: occasionally, stop since smoking. Once a month   Drug use: No   Sexual activity: Not on file  Other Topics Concern   Not on file  Social History Narrative   1 step and 1 bio      Lindie Spruce fulltimes   Social Determinants of Health   Financial Resource Strain: Not on file  Food Insecurity: Not on file  Transportation Needs: Not on  file  Physical Activity: Not on file  Stress: Not on file  Social Connections: Not on file  Intimate Partner Violence: Not on file    Past Surgical History:  Procedure Laterality Date   BACK SURGERY     CERVICAL SPINE SURGERY  2020   COLONOSCOPY WITH PROPOFOL N/A 08/25/2022   Procedure: COLONOSCOPY WITH PROPOFOL;  Surgeon: Toney Reil, MD;  Location: [facility] ENDOSCOPY;  Service: Gastroenterology;  Laterality: N/A;    Family History  Problem Relation Age of Onset   COPD Father    Frontotemporal dementia Father    Lung cancer Maternal Grandmother    Throat cancer Maternal Grandfather    Ovarian cancer Paternal Grandmother    Dementia Paternal Grandfather    Heart attack Paternal Grandfather     No Known Allergies  Current  Outpatient Medications on File Prior to Visit  Medication Sig Dispense Refill   cyclobenzaprine (FLEXERIL) 10 MG tablet Take 1 tablet (10 mg total) by mouth 3 (three) times daily as needed for muscle spasms. 30 tablet 0   pantoprazole (PROTONIX) 40 MG tablet Take 1 tablet by mouth daily.     No current facility-administered medications on file prior to visit.    BP 124/86   Pulse 80   Temp 98.2 F (36.8 C) (Temporal)   Ht 6\' 1"  (1.854 m)   Wt 216 lb (98 kg)   SpO2 98%   BMI 28.50 kg/m  Objective:   Physical Exam Constitutional:      General: He is not in acute distress. Cardiovascular:     Pulses:          Dorsalis pedis pulses are 2+ on the right side and 2+ on the left side.       Posterior tibial pulses are 2+ on the right side and 2+ on the left side.  Pulmonary:     Effort: Pulmonary effort is normal.  Musculoskeletal:       Feet:  Feet:     Right foot:     Skin integrity: No skin breakdown or erythema.     Left foot:     Skin integrity: No skin breakdown or erythema.     Comments: Tenderness to left plantar foot at metatarsal joints of 1-3 digits. Also tenderness to left medial foot. Neurological:     Mental Status: He is alert.           Assessment & Plan:  Plantar fasciitis, bilateral Assessment & Plan: Uncontrolled.  Offered treatment with gabapentin, he declines. Checking labs today including magnesium, A1C, vitamin D, B12, uric acid. Reviewed labs from September 2023. Normal CBC, CMP, and TSH. Referral placed to podiatry for further evaluation.  Reviewed orthopedic notes from December 2023 and labs from January 2024 through [facility].  Continue to wear orthotics.  Work note provided.   Orders: -     Ambulatory referral to Podiatry -     Uric acid  Paresthesias Assessment & Plan: Likely secondary to plantar fasciitis.  Offered treatment with gabapentin, he declines. Checking labs today including magnesium, A1C, vitamin D, B12.  Reviewed labs from September 2023. Normal CBC, CMP, and TSH. Referral placed to podiatry for further evaluation.  Reviewed orthopedic notes from December 2023 and labs from January 2024 through [facility]  Orders: -     Magnesium -     Hemoglobin A1c -     Vitamin B12  Vitamin D deficiency Assessment & Plan: Repeat vitamin D level pending. Currently not on  treatment.  Orders: -     VITAMIN D 25 Hydroxy (Vit-D Deficiency, Fractures)        Doreene Nest, NP

## 2023-04-06 NOTE — Assessment & Plan Note (Signed)
Uncontrolled.  Offered treatment with gabapentin, he declines. Checking labs today including magnesium, A1C, vitamin D, B12, uric acid. Reviewed labs from September 2023. Normal CBC, CMP, and TSH. Referral placed to podiatry for further evaluation.  Reviewed orthopedic notes from December 2023 and labs from January 2024 through [facility].  Continue to wear orthotics.  Work note provided.

## 2023-04-06 NOTE — Assessment & Plan Note (Signed)
Likely secondary to plantar fasciitis.  Offered treatment with gabapentin, he declines. Checking labs today including magnesium, A1C, vitamin D, B12. Reviewed labs from September 2023. Normal CBC, CMP, and TSH. Referral placed to podiatry for further evaluation.  Reviewed orthopedic notes from December 2023 and labs from January 2024 through [facility]

## 2023-04-13 ENCOUNTER — Ambulatory Visit: Payer: BC Managed Care – PPO | Admitting: Podiatry

## 2023-04-13 ENCOUNTER — Ambulatory Visit (INDEPENDENT_AMBULATORY_CARE_PROVIDER_SITE_OTHER): Payer: BC Managed Care – PPO

## 2023-04-13 ENCOUNTER — Encounter: Payer: Self-pay | Admitting: Podiatry

## 2023-04-13 DIAGNOSIS — M79672 Pain in left foot: Secondary | ICD-10-CM

## 2023-04-13 DIAGNOSIS — M7752 Other enthesopathy of left foot: Secondary | ICD-10-CM

## 2023-04-13 DIAGNOSIS — M7751 Other enthesopathy of right foot: Secondary | ICD-10-CM

## 2023-04-13 DIAGNOSIS — M79671 Pain in right foot: Secondary | ICD-10-CM

## 2023-04-13 DIAGNOSIS — Q828 Other specified congenital malformations of skin: Secondary | ICD-10-CM | POA: Diagnosis not present

## 2023-04-13 MED ORDER — TRIAMCINOLONE ACETONIDE 10 MG/ML IJ SUSP
20.0000 mg | Freq: Once | INTRAMUSCULAR | Status: AC
  Administered 2023-04-13: 20 mg

## 2023-04-13 NOTE — Progress Notes (Signed)
Subjective:   Patient ID: Parker Gomez, male   DOB: 47 y.o.   MRN: [id number]   HPI Patient presents stating that he thought maybe he tore his plantar fascia in December and has developed increased pain in both of his feet with the left fifth metatarsal head being very sore with fluid buildup lesion and the right third and fourth metatarsal MPJ joints being inflamed with lesion formation.  Also has gotten good feet orthotics which she states have given him temporary relief of discomfort patient does not smoke likes to be active   Review of Systems  All other systems reviewed and are negative.       Objective:  Physical Exam Vitals and nursing note reviewed.  Constitutional:      Appearance: He is well-developed.  Pulmonary:     Effort: Pulmonary effort is normal.  Musculoskeletal:        General: Normal range of motion.  Skin:    General: Skin is warm.  Neurological:     Mental Status: He is alert.     Neurovascular status found to be intact muscle strength was found to be adequate range of motion adequate with discomfort of a mild nature in the plantar fascia left over right but it appears the tendon is intact and not torn with inflammation fluid around the fifth MPJ left and third fourth MPJ right both of them thickened and painful with pressure.  Good digital perfusion well-oriented     Assessment:  Difficult to make complete determination as to the nature of this patient's pain is also back and neck issues could be contributory.  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  At this point I did sterile prep I injected around the fifth MPJ left 3 mg dexamethasone Kenalog 5 mg Xylocaine and for the right I injected around the third MPJ 3 mg dexamethasone Kenalog combination.  Courtesy debridement of lesions discussed possible orthotics to try to offload weight and  reappoint to recheck  X-rays were negative for signs of fracture or bony injury associated with condition appears to be soft tissue

## 2023-04-21 ENCOUNTER — Other Ambulatory Visit: Payer: Self-pay | Admitting: Podiatry

## 2023-04-21 DIAGNOSIS — M7751 Other enthesopathy of right foot: Secondary | ICD-10-CM

## 2023-04-21 DIAGNOSIS — M79671 Pain in right foot: Secondary | ICD-10-CM

## 2023-04-21 DIAGNOSIS — M7752 Other enthesopathy of left foot: Secondary | ICD-10-CM

## 2023-04-21 DIAGNOSIS — Q828 Other specified congenital malformations of skin: Secondary | ICD-10-CM

## 2023-06-01 ENCOUNTER — Ambulatory Visit: Payer: BC Managed Care – PPO | Admitting: Podiatry

## 2023-08-07 DIAGNOSIS — U071 COVID-19: Secondary | ICD-10-CM | POA: Diagnosis not present

## 2023-08-07 DIAGNOSIS — R0781 Pleurodynia: Secondary | ICD-10-CM | POA: Diagnosis not present

## 2023-08-07 DIAGNOSIS — R509 Fever, unspecified: Secondary | ICD-10-CM | POA: Diagnosis not present

## 2023-10-02 DIAGNOSIS — F4323 Adjustment disorder with mixed anxiety and depressed mood: Secondary | ICD-10-CM | POA: Diagnosis not present

## 2023-11-07 DIAGNOSIS — F4323 Adjustment disorder with mixed anxiety and depressed mood: Secondary | ICD-10-CM | POA: Diagnosis not present

## 2023-11-09 LAB — IRON,TIBC AND FERRITIN PANEL
Ferritin: 133
Iron: 80
UIBC: 153

## 2023-11-09 LAB — CBC: RBC: 4.3 (ref 3.87–5.11)

## 2023-11-09 LAB — CBC AND DIFFERENTIAL
HCT: 41 (ref 41–53)
Hemoglobin: 13.9 (ref 13.5–17.5)
Neutrophils Absolute: 5.1
Platelets: 474 10*3/uL — AB (ref 150–400)
WBC: 7.8

## 2023-11-09 LAB — COMPREHENSIVE METABOLIC PANEL
Albumin: 4.4 (ref 3.5–5.0)
Calcium: 10.1 (ref 8.7–10.7)
Globulin: 1.9
eGFR: 109

## 2023-11-09 LAB — BASIC METABOLIC PANEL
BUN: 9 (ref 4–21)
CO2: 22 (ref 13–22)
Chloride: 101 (ref 99–108)
Creatinine: 0.8 (ref 0.6–1.3)
Glucose: 123
Potassium: 4.6 meq/L (ref 3.5–5.1)
Sodium: 141 (ref 137–147)

## 2023-11-09 LAB — LIPID PANEL
Cholesterol: 183 (ref 0–200)
HDL: 43 (ref 35–70)
LDL Cholesterol: 124
Triglycerides: 85 (ref 40–160)

## 2023-11-09 LAB — HEPATIC FUNCTION PANEL
ALT: 17 U/L (ref 10–40)
AST: 14 (ref 14–40)
Bilirubin, Total: 0.3

## 2023-11-09 LAB — PSA: PSA: 1.5

## 2023-11-09 LAB — HEMOGLOBIN A1C: Hemoglobin A1C: 5.6

## 2023-11-09 LAB — VITAMIN B12: Vitamin B-12: 466

## 2023-11-16 ENCOUNTER — Ambulatory Visit: Payer: BC Managed Care – PPO | Admitting: Nurse Practitioner

## 2023-11-16 ENCOUNTER — Encounter: Payer: Self-pay | Admitting: Nurse Practitioner

## 2023-11-16 VITALS — BP 140/82 | HR 70 | Temp 97.9°F | Ht 73.0 in | Wt 181.4 lb

## 2023-11-16 DIAGNOSIS — N50812 Left testicular pain: Secondary | ICD-10-CM | POA: Diagnosis not present

## 2023-11-16 DIAGNOSIS — N529 Male erectile dysfunction, unspecified: Secondary | ICD-10-CM

## 2023-11-16 DIAGNOSIS — R1030 Lower abdominal pain, unspecified: Secondary | ICD-10-CM

## 2023-11-16 DIAGNOSIS — F322 Major depressive disorder, single episode, severe without psychotic features: Secondary | ICD-10-CM | POA: Diagnosis not present

## 2023-11-16 DIAGNOSIS — F411 Generalized anxiety disorder: Secondary | ICD-10-CM

## 2023-11-16 LAB — POC URINALSYSI DIPSTICK (AUTOMATED)
Bilirubin, UA: NEGATIVE
Blood, UA: NEGATIVE
Glucose, UA: NEGATIVE
Ketones, UA: NEGATIVE
Leukocytes, UA: NEGATIVE
Nitrite, UA: NEGATIVE
Protein, UA: NEGATIVE
Spec Grav, UA: 1.015 (ref 1.010–1.025)
Urobilinogen, UA: 0.2 U/dL
pH, UA: 7 (ref 5.0–8.0)

## 2023-11-16 MED ORDER — SILDENAFIL CITRATE 100 MG PO TABS: 100.0000 mg | ORAL_TABLET | Freq: Every day | ORAL | 2 refills | Status: DC | PRN

## 2023-11-16 MED ORDER — SERTRALINE HCL 50 MG PO TABS: ORAL_TABLET | ORAL | 0 refills | Status: DC

## 2023-11-16 NOTE — Progress Notes (Signed)
Established Patient Office Visit  Subjective   Patient ID: Parker Gomez, male    DOB: 1976/11/20  Age: 47 y.o. MRN: [id number]  Chief Complaint  Patient presents with   urology problems    Pt complains of abdominal pain, pt states that he has been more stressed than normal. Left testicle is a little painful. States that even with viagra he has not been able to have erection or ejaculate.    Medication Refill       Abdominal pain: states that it has been around the belly button and going into the left testicle over the past 2-3 days. States that it was there 2-3 weeks ago he started having ED.  States that it is intermittent in nature. States that having a BM does relieve it some. States that he has at more over the past couple days. States that his last BM was this morning. States that it was harder. States that he is having some frequency with dysuria.  States that since September he has been under more stress with his wife  ED: states that approx 2-3 weeks ago he was unable to get an erection. States that when he tried it was minimal and less ejacutlate. States that it did hurt with erectoin and ejaculation. States that he is trying vigara. Able to get an erection but not his normal and he had some pain   Stress: states that he did try lexapro for 2 days and he felt worse. States that he did not feel good with his emotions. He is Medical illustrator. States that he had SI approx 1 month ago. States that he did have a plan at the time.      11/16/2023    3:25 PM 07/26/2022   10:30 AM  PHQ9 SCORE ONLY  PHQ-9 Total Score 21 8       11/16/2023    3:26 PM  GAD 7 : Generalized Anxiety Score  Nervous, Anxious, on Edge 2  Control/stop worrying 2  Worry too much - different things 2  Trouble relaxing 2  Restless 2  Easily annoyed or irritable 2  Afraid - awful might happen 3  Total GAD 7 Score 15  Anxiety Difficulty Somewhat difficult          Review of Systems   Constitutional:  Negative for chills and fever.  Respiratory:  Negative for shortness of breath.   Cardiovascular:  Negative for chest pain.  Gastrointestinal:  Positive for constipation. Negative for abdominal pain, diarrhea, nausea and vomiting.  Neurological:  Negative for headaches.  Psychiatric/Behavioral:  Negative for hallucinations and suicidal ideas.       Objective:     BP (!) 140/82   Pulse 70   Temp 97.9 F (36.6 C) (Oral)   Ht 6\' 1"  (1.854 m)   Wt 181 lb 6.4 oz (82.3 kg)   SpO2 99%   BMI 23.93 kg/m    Physical Exam Vitals and nursing note reviewed. Exam conducted with a chaperone present Melina Copa).  Constitutional:      Appearance: Normal appearance.  Cardiovascular:     Rate and Rhythm: Normal rate and regular rhythm.     Heart sounds: Normal heart sounds.  Pulmonary:     Effort: Pulmonary effort is normal.     Breath sounds: Normal breath sounds.  Abdominal:     General: Bowel sounds are normal. There is no distension.     Palpations: There is no mass.     Tenderness: There  is abdominal tenderness in the left lower quadrant.     Hernia: No hernia is present. There is no hernia in the left inguinal area or right inguinal area.  Genitourinary:    Penis: Normal.      Testes: Normal. Cremasteric reflex is present.     Epididymis:     Right: Normal.     Left: Normal.  Lymphadenopathy:     Lower Body: No right inguinal adenopathy. No left inguinal adenopathy.  Neurological:     Mental Status: He is alert.      Results for orders placed or performed in visit on 11/16/23  POCT Urinalysis Dipstick (Automated)  Result Value Ref Range   Color, UA yellow    Clarity, UA clear    Glucose, UA Negative Negative   Bilirubin, UA neg    Ketones, UA neg    Spec Grav, UA 1.015 1.010 - 1.025   Blood, UA neg    pH, UA 7.0 5.0 - 8.0   Protein, UA Negative Negative   Urobilinogen, UA 0.2 0.2 or 1.0 E.U./dL   Nitrite, UA neg    Leukocytes, UA Negative  Negative      The ASCVD Risk score (Arnett DK, et al., 2019) failed to calculate for the following reasons:   Cannot find a previous HDL lab   Cannot find a previous total cholesterol lab    Assessment & Plan:   Problem List Items Addressed This Visit       Other   Pain in left testicle - Primary   Exam benign urine benign likely referred pain.  If this continues consider getting a CT scan to rule out occult hernia.  No hernia palpated on exam      Relevant Orders   POCT Urinalysis Dipstick (Automated) (Completed)   Lower abdominal pain   Ambiguous in nature likely constipation did offer to do KUB in office.  Patient declined.  Pending CBC and CMP      Relevant Orders   CBC   Comprehensive metabolic panel   Current severe episode of major depressive disorder without psychotic features without prior episode [facility])   Patient tried Zoloft in the past.  Was on escitalopram for 2 days but had adverse drug event so discontinued.  Patient denies active SI HI or AVH.  Resources given if patient gets into crises.  Start sertraline 25 mg nightly for 2 weeks then titrate to 50 mg nightly thereafter.      Relevant Medications   sertraline (ZOLOFT) 50 MG tablet   GAD (generalized anxiety disorder)   Patient currently doing counseling will start sertraline 25 mg nightly for 2 weeks then titrate up to sertraline 50 mg nightly thereafter.      Relevant Medications   sertraline (ZOLOFT) 50 MG tablet   Erectile dysfunction   Likely psychological given the increased stress and difficulties with his marriage.  Patient using sildenafil 100 mg as needed refill provided today      Relevant Medications   sildenafil (VIAGRA) 100 MG tablet    Return in about 6 weeks (around 12/28/2023) for MDD/GAD med recheck .    Audria Nine, NP

## 2023-11-16 NOTE — Assessment & Plan Note (Signed)
Patient tried Zoloft in the past.  Was on escitalopram for 2 days but had adverse drug event so discontinued.  Patient denies active SI HI or AVH.  Resources given if patient gets into crises.  Start sertraline 25 mg nightly for 2 weeks then titrate to 50 mg nightly thereafter.

## 2023-11-16 NOTE — Assessment & Plan Note (Signed)
Likely psychological given the increased stress and difficulties with his marriage.  Patient using sildenafil 100 mg as needed refill provided today

## 2023-11-16 NOTE — Assessment & Plan Note (Signed)
Ambiguous in nature likely constipation did offer to do KUB in office.  Patient declined.  Pending CBC and CMP

## 2023-11-16 NOTE — Assessment & Plan Note (Signed)
Patient currently doing counseling will start sertraline 25 mg nightly for 2 weeks then titrate up to sertraline 50 mg nightly thereafter.

## 2023-11-16 NOTE — Patient Instructions (Signed)
Nice to see you today I will be in touch with the labs once I have them Follow up with me in 6 weeks sooner if you need me  If you need immediate help with mental health go to the emergency room or call "[phone]"

## 2023-11-16 NOTE — Assessment & Plan Note (Signed)
Exam benign urine benign likely referred pain.  If this continues consider getting a CT scan to rule out occult hernia.  No hernia palpated on exam

## 2023-11-17 ENCOUNTER — Encounter: Payer: Self-pay | Admitting: Nurse Practitioner

## 2023-11-17 LAB — CBC
HCT: 43.8 % (ref 39.0–52.0)
Hemoglobin: 15.2 g/dL (ref 13.0–17.0)
MCHC: 34.6 g/dL (ref 30.0–36.0)
MCV: 95.6 fL (ref 78.0–100.0)
Platelets: 490 10*3/uL — ABNORMAL HIGH (ref 150.0–400.0)
RBC: 4.58 Mil/uL (ref 4.22–5.81)
RDW: 14.3 % (ref 11.5–15.5)
WBC: 10.4 10*3/uL (ref 4.0–10.5)

## 2023-11-17 LAB — COMPREHENSIVE METABOLIC PANEL
ALT: 17 U/L (ref 0–53)
AST: 14 U/L (ref 0–37)
Albumin: 4.6 g/dL (ref 3.5–5.2)
Alkaline Phosphatase: 58 U/L (ref 39–117)
BUN: 15 mg/dL (ref 6–23)
CO2: 29 meq/L (ref 19–32)
Calcium: 9.6 mg/dL (ref 8.4–10.5)
Chloride: 100 meq/L (ref 96–112)
Creatinine, Ser: 0.78 mg/dL (ref 0.40–1.50)
GFR: 106.81 mL/min (ref >60.00)
Glucose, Bld: 86 mg/dL (ref 70–99)
Potassium: 4.1 meq/L (ref 3.5–5.1)
Sodium: 139 meq/L (ref 135–145)
Total Bilirubin: 0.4 mg/dL (ref 0.2–1.2)
Total Protein: 6.4 g/dL (ref 6.0–8.3)

## 2023-11-21 DIAGNOSIS — F4323 Adjustment disorder with mixed anxiety and depressed mood: Secondary | ICD-10-CM | POA: Diagnosis not present

## 2023-11-26 ENCOUNTER — Encounter: Payer: Self-pay | Admitting: Nurse Practitioner

## 2023-11-26 DIAGNOSIS — F322 Major depressive disorder, single episode, severe without psychotic features: Secondary | ICD-10-CM

## 2023-11-27 MED ORDER — FLUOXETINE HCL 10 MG PO CAPS: ORAL_CAPSULE | ORAL | 0 refills | Status: DC

## 2023-12-07 DIAGNOSIS — F4323 Adjustment disorder with mixed anxiety and depressed mood: Secondary | ICD-10-CM | POA: Diagnosis not present

## 2023-12-07 MED ORDER — BUPROPION HCL ER (XL) 150 MG PO TB24: 150.0000 mg | ORAL_TABLET | Freq: Every day | ORAL | 0 refills | Status: DC

## 2023-12-07 NOTE — Addendum Note (Signed)
Addended by: Eden Emms on: 12/07/2023 03:57 PM   Modules accepted: Orders

## 2023-12-19 DIAGNOSIS — F4323 Adjustment disorder with mixed anxiety and depressed mood: Secondary | ICD-10-CM | POA: Diagnosis not present

## 2023-12-27 ENCOUNTER — Other Ambulatory Visit: Payer: Self-pay | Admitting: Nurse Practitioner

## 2023-12-31 ENCOUNTER — Other Ambulatory Visit: Payer: Self-pay | Admitting: Nurse Practitioner

## 2023-12-31 DIAGNOSIS — F322 Major depressive disorder, single episode, severe without psychotic features: Secondary | ICD-10-CM

## 2024-01-02 ENCOUNTER — Ambulatory Visit: Payer: BC Managed Care – PPO | Admitting: Nurse Practitioner

## 2024-01-02 VITALS — BP 102/70 | HR 74 | Temp 97.7°F | Ht 73.0 in | Wt 182.4 lb

## 2024-01-02 DIAGNOSIS — F322 Major depressive disorder, single episode, severe without psychotic features: Secondary | ICD-10-CM

## 2024-01-02 DIAGNOSIS — F411 Generalized anxiety disorder: Secondary | ICD-10-CM

## 2024-01-02 DIAGNOSIS — R5383 Other fatigue: Secondary | ICD-10-CM | POA: Diagnosis not present

## 2024-01-02 DIAGNOSIS — D75839 Thrombocytosis, unspecified: Secondary | ICD-10-CM | POA: Diagnosis not present

## 2024-01-02 DIAGNOSIS — N529 Male erectile dysfunction, unspecified: Secondary | ICD-10-CM

## 2024-01-02 DIAGNOSIS — Z9889 Other specified postprocedural states: Secondary | ICD-10-CM

## 2024-01-02 DIAGNOSIS — F4323 Adjustment disorder with mixed anxiety and depressed mood: Secondary | ICD-10-CM | POA: Diagnosis not present

## 2024-01-02 DIAGNOSIS — M5442 Lumbago with sciatica, left side: Secondary | ICD-10-CM | POA: Diagnosis not present

## 2024-01-02 LAB — CBC
HCT: 42.6 % (ref 39.0–52.0)
Hemoglobin: 14.5 g/dL (ref 13.0–17.0)
MCHC: 34.2 g/dL (ref 30.0–36.0)
MCV: 97 fL (ref 78.0–100.0)
Platelets: 391 10*3/uL (ref 150.0–400.0)
RBC: 4.39 Mil/uL (ref 4.22–5.81)
RDW: 14.5 % (ref 11.5–15.5)
WBC: 10.5 10*3/uL (ref 4.0–10.5)

## 2024-01-02 LAB — TESTOSTERONE: Testosterone: 470.03 ng/dL (ref 300.00–890.00)

## 2024-01-02 MED ORDER — BUPROPION HCL ER (XL) 150 MG PO TB24: 150.0000 mg | ORAL_TABLET | Freq: Every day | ORAL | 1 refills | Status: DC

## 2024-01-02 MED ORDER — TADALAFIL 10 MG PO TABS: 10.0000 mg | ORAL_TABLET | ORAL | 1 refills | Status: DC | PRN

## 2024-01-02 NOTE — Assessment & Plan Note (Signed)
Continuing therapy and Wellbutrin 50 mg daily.

## 2024-01-02 NOTE — Assessment & Plan Note (Signed)
Pending testosterone level today

## 2024-01-02 NOTE — Assessment & Plan Note (Signed)
Past 2 CBCs had elevated platelet count pending platelet count today

## 2024-01-02 NOTE — Assessment & Plan Note (Signed)
Pending testosterone level today.  Patient states sildenafil 100 mg quite effective and wanted to get the name brand Viagra after discussion we will try tadalafil 10 mg as needed sexual intercourse.  If ineffective consider doing namebrand Viagra

## 2024-01-02 NOTE — Progress Notes (Signed)
Established Patient Office Visit  Subjective   Patient ID: Parker Gomez, male    DOB: [birth year]  Age: 48 y.o. MRN: [id number]  Chief Complaint  Patient presents with   Follow-up    Pt states Wellbutrin is working well. Pt complains of making lifestyle changes, states not having as many negative thoughts.     HPI  GAD/MDD: Patient was seen by me on 11/16/2023 with several complaints.  Patient is on escitalopram and had adverse drug event within 2 days of taking it so discontinued it.  We did start patient on Zoloft to reach down to make him tolerated.  Plan to switch patient to fluoxetine but he declined to take that medication as anyone to drive Wellbutrin instead.  Patient was placed on Wellbutrin 150 and is here for follow-up. States that he is feeling more foucesed at work and on things. States that it does keep the negative thoughts at bay. States that he is doing counseling and doing ok with that. States that him and his wife is "ok" He is doing therapy every other week and the spouse goes every other session with him.  ED: Patient was started on Viagra 100 mg as needed sexual intercourse.  This was likely psychological given the increased stress and difficulties with his marriage at the time. He states that it has not been helpful  Testicle pain: states that he started hurting in his lower back and it was going into the left groin and down the leg. States that he took flexeril that has seemed to help.     Review of Systems  Constitutional:  Negative for chills and fever.  Respiratory:  Negative for shortness of breath.   Cardiovascular:  Negative for chest pain.  Gastrointestinal:        BM this morning  Neurological:  Negative for headaches.  Psychiatric/Behavioral:  Negative for hallucinations and suicidal ideas.       Objective:     BP 102/70   Pulse 74   Temp 97.7 F (36.5 C) (Oral)   Ht 6\' 1"  (1.854 m)   Wt 182 lb 6.4 oz (82.7 kg)   SpO2 99%   BMI 24.06  kg/m  BP Readings from Last 3 Encounters:  01/02/24 102/70  11/16/23 (!) 140/82  04/06/23 124/86   Wt Readings from Last 3 Encounters:  01/02/24 182 lb 6.4 oz (82.7 kg)  11/16/23 181 lb 6.4 oz (82.3 kg)  04/06/23 216 lb (98 kg)   SpO2 Readings from Last 3 Encounters:  01/02/24 99%  11/16/23 99%  04/06/23 98%      Physical Exam Vitals and nursing note reviewed.  Constitutional:      Appearance: Normal appearance.  Cardiovascular:     Rate and Rhythm: Normal rate and regular rhythm.     Heart sounds: Normal heart sounds.  Pulmonary:     Effort: Pulmonary effort is normal.     Breath sounds: Normal breath sounds.  Abdominal:     General: Bowel sounds are normal.  Musculoskeletal:     Lumbar back: No tenderness or bony tenderness. Positive left straight leg raise test. Negative right straight leg raise test.  Neurological:     Mental Status: He is alert.      No results found for any visits on 01/02/24.    The 10-year ASCVD risk score (Arnett DK, et al., 2019) is: 1.6%* (Cholesterol units were assumed)    Assessment & Plan:   Problem List Items Addressed This Visit  Nervous and Auditory   Acute bilateral low back pain with left-sided sciatica   Pain with coughing the pain in left testicle.  Offered to give patient some steroids he states he did have 2 courses this year.  Offered to give patient an IM injection he politely declined.  Will do sciatica rehab to see if this helps      Relevant Medications   escitalopram (LEXAPRO) 10 MG tablet   sertraline (ZOLOFT) 50 MG tablet   buPROPion (WELLBUTRIN XL) 150 MG 24 hr tablet     Hematopoietic and Hemostatic   Thrombocytosis   Past 2 CBCs had elevated platelet count pending platelet count today      Relevant Orders   CBC     Other   History of lumbar surgery   Other fatigue   Pending testosterone level today      Relevant Orders   Testosterone   Current severe episode of major depressive  disorder without psychotic features without prior episode [facility])   Patient currently in therapy on Wellbutrin 100 mg daily.  States he is feeling better with the medication and therapy.  Patient denies HI/SI/AVH.  Continue medication as prescribed      Relevant Medications   escitalopram (LEXAPRO) 10 MG tablet   sertraline (ZOLOFT) 50 MG tablet   buPROPion (WELLBUTRIN XL) 150 MG 24 hr tablet   GAD (generalized anxiety disorder) - Primary   Continuing therapy and Wellbutrin 50 mg daily.      Relevant Medications   escitalopram (LEXAPRO) 10 MG tablet   sertraline (ZOLOFT) 50 MG tablet   buPROPion (WELLBUTRIN XL) 150 MG 24 hr tablet   Erectile dysfunction   Pending testosterone level today.  Patient states sildenafil 100 mg quite effective and wanted to get the name brand Viagra after discussion we will try tadalafil 10 mg as needed sexual intercourse.  If ineffective consider doing namebrand Viagra      Relevant Medications   tadalafil (CIALIS) 10 MG tablet   Other Relevant Orders   Testosterone    Return in about 6 months (around 07/01/2024) for CPE and Labs.    Audria Nine, NP

## 2024-01-02 NOTE — Patient Instructions (Signed)
Nice to see you today I will be in touch with the labs once I have reviewed them Follow up with me in 6 months for your physical, sooner if you need me

## 2024-01-02 NOTE — Assessment & Plan Note (Signed)
Patient currently in therapy on Wellbutrin 100 mg daily.  States he is feeling better with the medication and therapy.  Patient denies HI/SI/AVH.  Continue medication as prescribed

## 2024-01-02 NOTE — Assessment & Plan Note (Signed)
Pain with coughing the pain in left testicle.  Offered to give patient some steroids he states he did have 2 courses this year.  Offered to give patient an IM injection he politely declined.  Will do sciatica rehab to see if this helps

## 2024-01-04 ENCOUNTER — Encounter: Payer: Self-pay | Admitting: Nurse Practitioner

## 2024-02-05 DIAGNOSIS — F4323 Adjustment disorder with mixed anxiety and depressed mood: Secondary | ICD-10-CM | POA: Diagnosis not present

## 2024-02-13 ENCOUNTER — Other Ambulatory Visit: Payer: Self-pay | Admitting: Nurse Practitioner

## 2024-02-13 DIAGNOSIS — F4323 Adjustment disorder with mixed anxiety and depressed mood: Secondary | ICD-10-CM | POA: Diagnosis not present

## 2024-02-13 DIAGNOSIS — N529 Male erectile dysfunction, unspecified: Secondary | ICD-10-CM

## 2024-02-15 ENCOUNTER — Encounter: Payer: Self-pay | Admitting: Emergency Medicine

## 2024-02-15 ENCOUNTER — Other Ambulatory Visit: Payer: Self-pay

## 2024-02-15 ENCOUNTER — Emergency Department

## 2024-02-15 ENCOUNTER — Emergency Department
Admission: EM | Admit: 2024-02-15 | Discharge: 2024-02-15 | Disposition: A | Discharge status: Home / Self Care | Attending: Emergency Medicine | Admitting: Emergency Medicine

## 2024-02-15 DIAGNOSIS — Z981 Arthrodesis status: Secondary | ICD-10-CM | POA: Diagnosis not present

## 2024-02-15 DIAGNOSIS — R0789 Other chest pain: Secondary | ICD-10-CM | POA: Diagnosis not present

## 2024-02-15 DIAGNOSIS — R0602 Shortness of breath: Secondary | ICD-10-CM | POA: Diagnosis not present

## 2024-02-15 DIAGNOSIS — R079 Chest pain, unspecified: Secondary | ICD-10-CM | POA: Diagnosis not present

## 2024-02-15 LAB — CBC
HCT: 40.1 % (ref 39.0–52.0)
Hemoglobin: 13.8 g/dL (ref 13.0–17.0)
MCH: 32.9 pg (ref 26.0–34.0)
MCHC: 34.4 g/dL (ref 30.0–36.0)
MCV: 95.7 fL (ref 80.0–100.0)
Platelets: 356 10*3/uL (ref 150–400)
RBC: 4.19 MIL/uL — ABNORMAL LOW (ref 4.22–5.81)
RDW: 13.2 % (ref 11.5–15.5)
WBC: 7.5 10*3/uL (ref 4.0–10.5)
nRBC: 0 % (ref 0.0–0.2)

## 2024-02-15 LAB — BASIC METABOLIC PANEL WITH GFR
Anion gap: 9 (ref 5–15)
BUN: 10 mg/dL (ref 6–20)
CO2: 23 mmol/L (ref 22–32)
Calcium: 9.2 mg/dL (ref 8.9–10.3)
Chloride: 105 mmol/L (ref 98–111)
Creatinine, Ser: 0.81 mg/dL (ref 0.61–1.24)
GFR, Estimated: >60 mL/min (ref >60)
Glucose, Bld: 111 mg/dL — ABNORMAL HIGH (ref 70–99)
Potassium: 3.6 mmol/L (ref 3.5–5.1)
Sodium: 137 mmol/L (ref 135–145)

## 2024-02-15 LAB — HEPATIC FUNCTION PANEL
ALT: 15 U/L (ref 0–44)
AST: 18 U/L (ref 15–41)
Albumin: 4.1 g/dL (ref 3.5–5.0)
Alkaline Phosphatase: 58 U/L (ref 38–126)
Bilirubin, Direct: 0.1 mg/dL (ref 0.0–0.2)
Indirect Bilirubin: 0.2 mg/dL — ABNORMAL LOW (ref 0.3–0.9)
Total Bilirubin: 0.3 mg/dL (ref 0.0–1.2)
Total Protein: 6.1 g/dL — ABNORMAL LOW (ref 6.5–8.1)

## 2024-02-15 LAB — TROPONIN I (HIGH SENSITIVITY)
Troponin I (High Sensitivity): 3 ng/L (ref <18)
Troponin I (High Sensitivity): 8 ng/L (ref <18)

## 2024-02-15 LAB — LIPASE, BLOOD: Lipase: 29 U/L (ref 11–51)

## 2024-02-15 NOTE — ED Triage Notes (Addendum)
 Patient ambulatory to triage with steady gait, without difficulty or distress noted; pt reports mid CP radiating into left arm/neck that began this morning enroute to work; has had some intermittent pain for last 2wks accomp by [facility] esp with exertion

## 2024-02-15 NOTE — ED Provider Notes (Signed)
 [facility] Provider Note    Event Date/Time   First MD Initiated Contact with Patient 02/15/24 [phone]     (approximate)   History   Chief Complaint Chest Pain   HPI  Parker Gomez is a 48 y.o. male with past medical history of GAD and paresthesias who presents to the ED complaining of chest pain.  Patient reports that he has been dealing with about 2 weeks of intermittent burning discomfort in the center of his chest.  Pain does not seem to be brought on by anything in particular, can occur at rest, but he states that it gets worse when he is walking or moving his arms while sitting still.  He reports feeling short of breath when the pain is present, has had a dry cough for a couple of weeks but denies any fevers.  He has not noticed any pain or swelling in his legs, denies any history of DVT/PE.  Pain became more severe as he was walking into work this morning so he decided to seek care.  He denies any cardiac history.     Physical Exam   Triage Vital Signs: ED Triage Vitals  Encounter Vitals Group     BP 02/15/24 0531 130/73     Systolic BP Percentile --      Diastolic BP Percentile --      Pulse Rate 02/15/24 0531 72     Resp 02/15/24 0531 18     Temp 02/15/24 0531 97.7 F (36.5 C)     Temp Source 02/15/24 0531 Oral     SpO2 02/15/24 0531 100 %     Weight 02/15/24 0528 180 lb (81.6 kg)     Height 02/15/24 0528 6\' 1"  (1.854 m)     Head Circumference --      Peak Flow --      Pain Score 02/15/24 0528 5     Pain Loc --      Pain Education --      Exclude from Growth Chart --     Most recent vital signs: Vitals:   02/15/24 0531 02/15/24 0627  BP: 130/73 137/79  Pulse: 72 (!) 59  Resp: 18 20  Temp: 97.7 F (36.5 C)   SpO2: 100% 100%    Constitutional: Alert and oriented. Eyes: Conjunctivae are normal. Head: Atraumatic. Nose: No congestion/rhinnorhea. Mouth/Throat: Mucous membranes are moist.  Cardiovascular: Normal rate, regular  rhythm. Grossly normal heart sounds.  2+ radial pulses bilaterally. Respiratory: Normal respiratory effort.  No retractions. Lungs CTAB. Gastrointestinal: Soft and nontender. No distention. Musculoskeletal: No lower extremity tenderness nor edema.  Neurologic:  Normal speech and language. No gross focal neurologic deficits are appreciated.    ED Results / Procedures / Treatments   Labs (all labs ordered are listed, but only abnormal results are displayed) Labs Reviewed  CBC - Abnormal; Notable for the following components:      Result Value   RBC 4.19 (*)    All other components within normal limits  BASIC METABOLIC PANEL - Abnormal; Notable for the following components:   Glucose, Bld 111 (*)    All other components within normal limits  HEPATIC FUNCTION PANEL - Abnormal; Notable for the following components:   Total Protein 6.1 (*)    Indirect Bilirubin 0.2 (*)    All other components within normal limits  LIPASE, BLOOD  TROPONIN I (HIGH SENSITIVITY)  TROPONIN I (HIGH SENSITIVITY)     EKG  ED ECG REPORT I,  Chesley Noon, the attending physician, personally viewed and interpreted this ECG.   Date: 02/15/2024  EKG Time: 5:35  Rate: 71  Rhythm: normal sinus rhythm  Axis: RAD  Intervals:none  ST&T Change: None  RADIOLOGY Chest x-ray reviewed and interpreted by me with no infiltrate, edema, or effusion.  PROCEDURES:  Critical Care performed: No  Procedures   MEDICATIONS ORDERED IN ED: Medications - No data to display   IMPRESSION / MDM / ASSESSMENT AND PLAN / ED COURSE  I reviewed the triage vital signs and the nursing notes.                              48 y.o. male with past medical history of anxiety and paresthesias who presents to the ED complaining of intermittent chest pain and shortness of breath for the past 2 weeks, more severe this morning.  Patient's presentation is most consistent with acute presentation with potential threat to life or  bodily function.  Differential diagnosis includes, but is not limited to, ACS, PE, pneumonia, pneumothorax, musculoskeletal pain, GERD, anxiety.  Patient nontoxic-appearing and in no acute distress, vital signs are unremarkable.  EKG shows no evidence of arrhythmia or ischemia initial troponin within normal limits and symptoms seem atypical for ACS.  Given acute worsening of his symptoms, we will observe on cardiac monitor and check second set troponin.  Low suspicion for PE as patient is PERC negative.  Chest x-ray is pending, additional labs are reassuring with no significant anemia, leukocytosis, lecture abnormality, or AKI.  LFTs are also unremarkable.  Repeat troponin is stable and symptoms improved on reassessment.  Chest x-ray is also unremarkable and patient is appropriate for discharge home with outpatient cardiology follow-up.  He was counseled to return to the ED for new or worsening symptoms.  Patient agrees with plan.      FINAL CLINICAL IMPRESSION(S) / ED DIAGNOSES   Final diagnoses:  Atypical chest pain     Rx / DC Orders   ED Discharge Orders          Ordered    Ambulatory referral to Cardiology        02/15/24 0853             Note:  This document was prepared using Dragon voice recognition software and may include unintentional dictation errors.   Chesley Noon, MD 02/15/24 [phone]

## 2024-03-12 DIAGNOSIS — F4323 Adjustment disorder with mixed anxiety and depressed mood: Secondary | ICD-10-CM | POA: Diagnosis not present

## 2024-03-19 ENCOUNTER — Emergency Department
Admission: EM | Admit: 2024-03-19 | Discharge: 2024-03-19 | Disposition: A | Discharge status: Home / Self Care | Attending: Emergency Medicine | Admitting: Emergency Medicine

## 2024-03-19 ENCOUNTER — Other Ambulatory Visit: Payer: Self-pay

## 2024-03-19 ENCOUNTER — Emergency Department

## 2024-03-19 DIAGNOSIS — R079 Chest pain, unspecified: Secondary | ICD-10-CM | POA: Diagnosis not present

## 2024-03-19 DIAGNOSIS — R0789 Other chest pain: Secondary | ICD-10-CM | POA: Diagnosis not present

## 2024-03-19 LAB — BASIC METABOLIC PANEL WITH GFR
Anion gap: 9 (ref 5–15)
BUN: 17 mg/dL (ref 6–20)
CO2: 24 mmol/L (ref 22–32)
Calcium: 9.3 mg/dL (ref 8.9–10.3)
Chloride: 103 mmol/L (ref 98–111)
Creatinine, Ser: 1.05 mg/dL (ref 0.61–1.24)
GFR, Estimated: >60 mL/min (ref >60)
Glucose, Bld: 105 mg/dL — ABNORMAL HIGH (ref 70–99)
Potassium: 4.3 mmol/L (ref 3.5–5.1)
Sodium: 136 mmol/L (ref 135–145)

## 2024-03-19 LAB — CBC
HCT: 37.9 % — ABNORMAL LOW (ref 39.0–52.0)
Hemoglobin: 13.2 g/dL (ref 13.0–17.0)
MCH: 32.9 pg (ref 26.0–34.0)
MCHC: 34.8 g/dL (ref 30.0–36.0)
MCV: 94.5 fL (ref 80.0–100.0)
Platelets: 347 10*3/uL (ref 150–400)
RBC: 4.01 MIL/uL — ABNORMAL LOW (ref 4.22–5.81)
RDW: 13 % (ref 11.5–15.5)
WBC: 7.7 10*3/uL (ref 4.0–10.5)
nRBC: 0 % (ref 0.0–0.2)

## 2024-03-19 LAB — BRAIN NATRIURETIC PEPTIDE: B Natriuretic Peptide: 27.2 pg/mL (ref 0.0–100.0)

## 2024-03-19 LAB — TROPONIN I (HIGH SENSITIVITY)
Troponin I (High Sensitivity): 9 ng/L (ref <18)
Troponin I (High Sensitivity): 11 ng/L (ref <18)

## 2024-03-19 MED ORDER — IBUPROFEN 600 MG PO TABS
600.0000 mg | ORAL_TABLET | Freq: Once | ORAL | Status: AC
  Administered 2024-03-19: 600 mg via ORAL
  Filled 2024-03-19: qty 1

## 2024-03-19 MED ORDER — NITROGLYCERIN 0.4 MG SL SUBL
0.4000 mg | SUBLINGUAL_TABLET | SUBLINGUAL | Status: DC | PRN
  Administered 2024-03-19: 0.4 mg via SUBLINGUAL
  Filled 2024-03-19: qty 1

## 2024-03-19 NOTE — ED Triage Notes (Signed)
 Patient comes in via pov with complaints of chest pain that started back in March and has just come an gone since. Patient states that he felt the pain sensation 3 times today and that the chest pain is worse with exertion. Pt is alert and oriented x4 with no signs of acute distress at this time.

## 2024-03-19 NOTE — ED Provider Notes (Signed)
 [facility] Provider Note    Event Date/Time   First MD Initiated Contact with Patient 03/19/24 1606     (approximate)   History   Chest Pain   HPI  Parker Gomez is a 48 y.o. male with PMH of spinal stenosis, anxiety and depression who presents for evaluation of chest pain. Patient states that he had 3 episodes of chest pain today. Patient describes his chest pain as tightness and is having a hard time catching his breath. He states that it is associated with activity.  Reports that these episodes have been happening more frequently and that it is taking longer for him to get back to baseline.  He has occasional radiation into both arms but not to the back.  Describes the pain as a burning pain.      Physical Exam   Triage Vital Signs: ED Triage Vitals  Encounter Vitals Group     BP 03/19/24 1409 (!) 140/80     Systolic BP Percentile --      Diastolic BP Percentile --      Pulse Rate 03/19/24 1409 66     Resp 03/19/24 1409 18     Temp --      Temp src --      SpO2 03/19/24 1409 100 %     Weight 03/19/24 1411 181 lb (82.1 kg)     Height 03/19/24 1411 6\' 1"  (1.854 m)     Head Circumference --      Peak Flow --      Pain Score 03/19/24 1410 5     Pain Loc --      Pain Education --      Exclude from Growth Chart --     Most recent vital signs: Vitals:   03/19/24 1724 03/19/24 1918  BP: 130/65 125/77  Pulse: 71 (!) 53  Resp: 18 18  Temp:  97.7 F (36.5 C)  SpO2: 100% 98%   General: Awake, no distress.  CV:  Good peripheral perfusion. RRR. Resp:  Normal effort.  CTAB. Abd:  No distention.  Other:  Radial pulse 2+ and regular bilaterally.  Dorsalis pedis pulse 2+ and regular bilaterally.  No JVD.  Chest pain is not reproducible with palpation.   ED Results / Procedures / Treatments   Labs (all labs ordered are listed, but only abnormal results are displayed) Labs Reviewed  BASIC METABOLIC PANEL WITH GFR - Abnormal; Notable for the  following components:      Result Value   Glucose, Bld 105 (*)    All other components within normal limits  CBC - Abnormal; Notable for the following components:   RBC 4.01 (*)    HCT 37.9 (*)    All other components within normal limits  BRAIN NATRIURETIC PEPTIDE  TROPONIN I (HIGH SENSITIVITY)  TROPONIN I (HIGH SENSITIVITY)     EKG  ED provider interpretation: normal sinus rhythm with no ST changes, rightward axis.  Vent. rate 63 BPM PR interval 150 ms QRS duration 86 ms QT/QTcB 380/388 ms P-R-T axes 79 90 86   RADIOLOGY  Chest x-ray obtained, I interpreted the images as well as reviewed the radiologist report, which was negative for any acute cardiopulmonary abnormalities.   PROCEDURES:  Critical Care performed: No  Procedures   MEDICATIONS ORDERED IN ED: Medications  nitroGLYCERIN (NITROSTAT) SL tablet 0.4 mg (0.4 mg Sublingual Given 03/19/24 1723)  ibuprofen  (ADVIL ) tablet 600 mg (600 mg Oral Given 03/19/24 1748)  IMPRESSION / MDM / ASSESSMENT AND PLAN / ED COURSE  I reviewed the triage vital signs and the nursing notes.                             47 year old male presents for evaluation of chest pain.  Vital signs are stable and patient NAD on exam.  Differential diagnosis includes, but is not limited to, ACS, pneumonia, pulmonary embolism, GERD, anxiety, muscle strain.  Patient's presentation is most consistent with acute presentation with potential threat to life or bodily function.  Workup is overall reassuring.  CBC and BMP are unremarkable. BNP is not elevated.  Troponin is not elevated x 2.  Chest xray is negative and EKG shows NSR with no st changes.   No evidence of pneumonia or pneumothorax on chest x-ray.  Patient is PERC negative.  Chest pain is not reproducible making muscle strain less likely.  On initial assessment patient denied having chest pain.  When patient was reassessed he reported development of chest pain.  He was given a  nitroglycerin tablet and EKG was repeated.  There were no changes in the EKG when compared to the previous one.  Patient requested some ibuprofen  for headache.  Upon reassessment he reported his chest pain had improved and he no longer had a headache.  He was unable to differentiate between whether the nitro or the ibuprofen  improved his chest pain.  Given patient's description of his pain being worse with exertion I do feel this is consistent with a cardiac cause however workup is negative today.  Patient does have a history of GERD but is already taking pantoprazole to treat this.  He takes Wellbutrin  for anxiety but reports that concerns about the chest pain is making his anxiety worse.  I feel that patient's chest pain is cardiac in nature with components that are related to GERD and anxiety.  Patient's heart score is 3 and his chest pain has resolved at this time so I feel he is stable to go home.  I did provide him with information for different cardiology office that he can call to see if he can get a sooner appointment.  We discussed return precautions.  He was given a note for work.  Patient was agreeable to plan, voiced understanding and was stable at discharge.      FINAL CLINICAL IMPRESSION(S) / ED DIAGNOSES   Final diagnoses:  Chest pain, unspecified type     Rx / DC Orders   ED Discharge Orders     None        Note:  This document was prepared using Dragon voice recognition software and may include unintentional dictation errors.   Phyliss Breen, PA-C 03/19/24 2309    Shane Darling, MD 03/20/24 [phone]

## 2024-03-19 NOTE — Discharge Instructions (Addendum)
 Your work up today was reassuring. The blood work was normal. Your chest xray didn't show any abnormalities and your EKG was normal.  Please return to the ED with any worsening chest pain.   I have attached information for a different cardiology office that you can call to see if you can get a sooner appointment.

## 2024-03-19 NOTE — ED Notes (Signed)
 States he has had intermittent chest pain for about 2 weeks  States pain increases with movement  and associated with SOB

## 2024-03-21 DIAGNOSIS — Z720 Tobacco use: Secondary | ICD-10-CM | POA: Diagnosis not present

## 2024-03-21 DIAGNOSIS — R079 Chest pain, unspecified: Secondary | ICD-10-CM | POA: Diagnosis not present

## 2024-03-21 DIAGNOSIS — Z79899 Other long term (current) drug therapy: Secondary | ICD-10-CM | POA: Diagnosis not present

## 2024-03-21 DIAGNOSIS — I200 Unstable angina: Secondary | ICD-10-CM | POA: Diagnosis not present

## 2024-03-21 DIAGNOSIS — Z8249 Family history of ischemic heart disease and other diseases of the circulatory system: Secondary | ICD-10-CM | POA: Diagnosis not present

## 2024-03-22 ENCOUNTER — Ambulatory Visit: Admitting: Nurse Practitioner

## 2024-03-22 VITALS — BP 116/62 | HR 65 | Temp 98.0°F | Ht 73.0 in | Wt 177.2 lb

## 2024-03-22 DIAGNOSIS — I2089 Other forms of angina pectoris: Secondary | ICD-10-CM | POA: Insufficient documentation

## 2024-03-22 DIAGNOSIS — Z9889 Other specified postprocedural states: Secondary | ICD-10-CM | POA: Diagnosis not present

## 2024-03-22 DIAGNOSIS — R634 Abnormal weight loss: Secondary | ICD-10-CM | POA: Insufficient documentation

## 2024-03-22 DIAGNOSIS — K219 Gastro-esophageal reflux disease without esophagitis: Secondary | ICD-10-CM | POA: Insufficient documentation

## 2024-03-22 DIAGNOSIS — F411 Generalized anxiety disorder: Secondary | ICD-10-CM | POA: Diagnosis not present

## 2024-03-22 MED ORDER — PANTOPRAZOLE SODIUM 40 MG PO TBEC: 40.0000 mg | DELAYED_RELEASE_TABLET | Freq: Every day | ORAL | 0 refills | Status: DC

## 2024-03-22 MED ORDER — HYDROXYZINE HCL 10 MG PO TABS: 10.0000 mg | ORAL_TABLET | Freq: Three times a day (TID) | ORAL | 0 refills | Status: DC | PRN

## 2024-03-22 MED ORDER — CYCLOBENZAPRINE HCL 10 MG PO TABS: 10.0000 mg | ORAL_TABLET | Freq: Three times a day (TID) | ORAL | 0 refills | Status: DC | PRN

## 2024-03-22 NOTE — Assessment & Plan Note (Signed)
 When patient starts having anginal symptoms he has a rotation.  He does state this is heartburn related he has been doing Tums and minutes over-the-counter.  Will refill patient's Protonix for 90 days that is acute.  And he consider weaning back down or coming off altogether to see if this truly reflux related

## 2024-03-22 NOTE — Assessment & Plan Note (Signed)
 Concern for weight loss as patient has lost approximately 4 pounds in less than a week.  Did review historical weight with patient he has been hanging around the 180s.  Patient spouse was on the phone with concern that Wellbutrin  is contributing to weight gain we will continue Wellbutrin  as is patient will have close follow-up with me in 1 month status post cardiac catheterization to see where his weight is and then we can investigate further if need be

## 2024-03-22 NOTE — Progress Notes (Signed)
 Acute Office Visit  Subjective:     Patient ID: Parker Gomez, male    DOB: [birth year], 48 y.o.   MRN: [id number]  Chief Complaint  Patient presents with   Hospitalization Follow-up    Pt complains of resting more. Pt states that when active the chest pain starts. Pt states he unintentionally lost 3-4 pounds in the past 3 days.    Medication Refill    Pantoprazole and Flexeril .     HPI Patient is in today for hospital follow-up  Patient was seen in the emergency department on 03/19/2024 for evaluation of chest pain.  He had 3 episodes of chest pain that day the pain was described as tightness and having a hard time catching his breath.  He also associated it with activity.  Per his report these were happening more frequently and is taking longer for him to recover each episode.  Sometimes has radiation to both arms but not the back.  Patient did have workup that showed negative troponins and reassuring with normal CBC and BMP.  Chest x-ray was negative EKG was normal sinus rhythm.  Patient was referred to cardiology for further workup and agreement to his condition which was felt to be cardiac.  Patient was seen by cardiology yesterday at the [facility] clinic.  They recommended ASAP left heart catheterization with possible PCI.  They started patient on low-dose aspirin 81 mg daily.  He had taken Cialis  the day before so they decided not to start Imdur but did start on low-dose amlodipine for antianginal agent they do plan to obtain an echocardiogram.  They recommended limit physical activity until further cardiac evaluation if he has ruled current symptoms that will go away he was made aware to go to the emergency department.  Does not like he had a cardiac catheterization in 5 days on 03/27/2024 with Dr. Beau Bound  GAD: states that he started taking the wellbutrin  in janurayr and then stopped in feburary. States that he did not have negative thoughts. Spouse reports that he looked "down" or  staring off into space. States that he is still enjoying hobbies. States that he has tried hyrdoxzyine in the past but made him jittery.   GERD: states that he has been taking protonix. States that he has been on it since a month he is taking it every day. State that when he has chest pain it will radiate down    Review of Systems  Constitutional:  Negative for chills and fever.  Respiratory:  Positive for cough and shortness of breath.   Cardiovascular:  Positive for chest pain.  Psychiatric/Behavioral:  Negative for hallucinations and suicidal ideas. The patient does not have insomnia.         Objective:    BP 116/62   Pulse 65   Temp 98 F (36.7 C) (Oral)   Ht 6\' 1"  (1.854 m)   Wt 177 lb 3.2 oz (80.4 kg)   SpO2 99%   BMI 23.38 kg/m  BP Readings from Last 3 Encounters:  03/22/24 116/62  03/19/24 125/77  02/15/24 134/77   Wt Readings from Last 3 Encounters:  03/22/24 177 lb 3.2 oz (80.4 kg)  03/19/24 181 lb (82.1 kg)  02/15/24 180 lb (81.6 kg)   SpO2 Readings from Last 3 Encounters:  03/22/24 99%  03/19/24 98%  02/15/24 100%      Physical Exam Vitals and nursing note reviewed.  Constitutional:      Appearance: Normal appearance.  Cardiovascular:  Rate and Rhythm: Normal rate and regular rhythm.     Heart sounds: Normal heart sounds.  Pulmonary:     Effort: Pulmonary effort is normal.     Breath sounds: Normal breath sounds.  Abdominal:     General: Bowel sounds are normal. There is no distension.     Palpations: There is no mass.     Tenderness: There is no abdominal tenderness.     Hernia: No hernia is present.  Neurological:     Mental Status: He is alert.     No results found for any visits on 03/22/24.      Assessment & Plan:   Problem List Items Addressed This Visit       Cardiovascular and Mediastinum   Stable angina [facility])   Patient is been evaluated in the emergency department twice for this he has seen cardiologist with the urgent  cardiac catheterization being planned for 03/27/2024.  Stress patient having rest inclusive of no sexual activity.  Patient was given signs and symptoms when to seek emergent health care.  Patient also encouraged to stop smoking follow-up with cardiology as recommended for office visit and procedure      Relevant Medications   cyclobenzaprine  (FLEXERIL ) 10 MG tablet     Digestive   Gastroesophageal reflux disease   When patient starts having anginal symptoms he has a rotation.  He does state this is heartburn related he has been doing Tums and minutes over-the-counter.  Will refill patient's Protonix for 90 days that is acute.  And he consider weaning back down or coming off altogether to see if this truly reflux related      Relevant Medications   pantoprazole (PROTONIX) 40 MG tablet     Other   History of lumbar surgery   Relevant Medications   cyclobenzaprine  (FLEXERIL ) 10 MG tablet   History of cervical spinal surgery   Relevant Medications   cyclobenzaprine  (FLEXERIL ) 10 MG tablet   GAD (generalized anxiety disorder) - Primary   Patient has tried and failed sertraline  with adverse drug event of suicidal ideations.  Patient was hesitant to start fluoxetine  and never did.  Patient currently maintained on Wellbutrin  150 mg daily.  Patient spouse was on the phone and concerned that this medication is causing patient's weight loss.  Lost 4 pounds in the past week.  Patient had taken a hiatus of the medication in February.  We will continue Wellbutrin  currently and add on hydroxyzine 10 mg 3 times daily as needed with sedation precautions.  Once patient gets past his cardiac catheterization we will see how he is doing with weight and mood and consider switching to an SNRI or SSRI at that juncture.      Relevant Medications   hydrOXYzine (ATARAX) 10 MG tablet   Weight loss   Concern for weight loss as patient has lost approximately 4 pounds in less than a week.  Did review historical weight  with patient he has been hanging around the 180s.  Patient spouse was on the phone with concern that Wellbutrin  is contributing to weight gain we will continue Wellbutrin  as is patient will have close follow-up with me in 1 month status post cardiac catheterization to see where his weight is and then we can investigate further if need be       Meds ordered this encounter  Medications   hydrOXYzine (ATARAX) 10 MG tablet    Sig: Take 1 tablet (10 mg total) by mouth 3 (three) times daily  as needed.    Dispense:  30 tablet    Refill:  0    Supervising Provider:   Deri Fleet A [[id number]]   cyclobenzaprine  (FLEXERIL ) 10 MG tablet    Sig: Take 1 tablet (10 mg total) by mouth 3 (three) times daily as needed for muscle spasms.    Dispense:  30 tablet    Refill:  0    Supervising Provider:   Deri Fleet A [[id number]]   pantoprazole (PROTONIX) 40 MG tablet    Sig: Take 1 tablet (40 mg total) by mouth daily.    Dispense:  90 tablet    Refill:  0    Supervising Provider:   Deri Fleet A [[id number]]    Return in about 4 weeks (around 04/19/2024) for GAD/MDD/Weight recheck .  Margarie Shay, NP

## 2024-03-22 NOTE — Assessment & Plan Note (Addendum)
 Patient is been evaluated in the emergency department twice for this he has seen cardiologist with the urgent cardiac catheterization being planned for 03/27/2024.  Stress patient having rest inclusive of no sexual activity.  Patient was given signs and symptoms when to seek emergent health care.  Patient also encouraged to stop smoking follow-up with cardiology as recommended for office visit and procedure

## 2024-03-22 NOTE — Assessment & Plan Note (Signed)
 Patient has tried and failed sertraline  with adverse drug event of suicidal ideations.  Patient was hesitant to start fluoxetine  and never did.  Patient currently maintained on Wellbutrin  150 mg daily.  Patient spouse was on the phone and concerned that this medication is causing patient's weight loss.  Lost 4 pounds in the past week.  Patient had taken a hiatus of the medication in February.  We will continue Wellbutrin  currently and add on hydroxyzine 10 mg 3 times daily as needed with sedation precautions.  Once patient gets past his cardiac catheterization we will see how he is doing with weight and mood and consider switching to an SNRI or SSRI at that juncture.

## 2024-03-22 NOTE — Patient Instructions (Addendum)
 Nice to see you today Lets continue the Wellbutrin  and add on hydroxyzine, this can make you sleepy Follow up with me in 1 month, sooner if you need me

## 2024-03-25 DIAGNOSIS — F4323 Adjustment disorder with mixed anxiety and depressed mood: Secondary | ICD-10-CM | POA: Diagnosis not present

## 2024-03-27 ENCOUNTER — Encounter
Admission: RE | Disposition: A | Discharge status: Other Acute Inpatient Hospital | Payer: Self-pay | Source: Home / Self Care | Attending: Cardiology

## 2024-03-27 ENCOUNTER — Encounter: Payer: Self-pay | Admitting: Internal Medicine

## 2024-03-27 ENCOUNTER — Other Ambulatory Visit: Payer: Self-pay

## 2024-03-27 ENCOUNTER — Observation Stay: Admission: RE | Admit: 2024-03-27 | Discharge: 2024-03-27 | Attending: Cardiology | Admitting: Cardiology

## 2024-03-27 DIAGNOSIS — K219 Gastro-esophageal reflux disease without esophagitis: Secondary | ICD-10-CM | POA: Diagnosis not present

## 2024-03-27 DIAGNOSIS — M549 Dorsalgia, unspecified: Secondary | ICD-10-CM | POA: Diagnosis not present

## 2024-03-27 DIAGNOSIS — Z951 Presence of aortocoronary bypass graft: Secondary | ICD-10-CM | POA: Diagnosis not present

## 2024-03-27 DIAGNOSIS — I25110 Atherosclerotic heart disease of native coronary artery with unstable angina pectoris: Principal | ICD-10-CM | POA: Insufficient documentation

## 2024-03-27 DIAGNOSIS — F411 Generalized anxiety disorder: Secondary | ICD-10-CM | POA: Diagnosis not present

## 2024-03-27 DIAGNOSIS — Z79899 Other long term (current) drug therapy: Secondary | ICD-10-CM | POA: Diagnosis not present

## 2024-03-27 DIAGNOSIS — J9811 Atelectasis: Secondary | ICD-10-CM | POA: Diagnosis not present

## 2024-03-27 DIAGNOSIS — F32A Depression, unspecified: Secondary | ICD-10-CM | POA: Diagnosis not present

## 2024-03-27 DIAGNOSIS — R918 Other nonspecific abnormal finding of lung field: Secondary | ICD-10-CM | POA: Diagnosis not present

## 2024-03-27 DIAGNOSIS — J439 Emphysema, unspecified: Secondary | ICD-10-CM | POA: Diagnosis not present

## 2024-03-27 DIAGNOSIS — I200 Unstable angina: Principal | ICD-10-CM | POA: Diagnosis present

## 2024-03-27 DIAGNOSIS — F17200 Nicotine dependence, unspecified, uncomplicated: Secondary | ICD-10-CM | POA: Diagnosis not present

## 2024-03-27 DIAGNOSIS — I25119 Atherosclerotic heart disease of native coronary artery with unspecified angina pectoris: Secondary | ICD-10-CM | POA: Diagnosis not present

## 2024-03-27 DIAGNOSIS — Z01818 Encounter for other preprocedural examination: Secondary | ICD-10-CM | POA: Diagnosis not present

## 2024-03-27 DIAGNOSIS — J9622 Acute and chronic respiratory failure with hypercapnia: Secondary | ICD-10-CM | POA: Diagnosis not present

## 2024-03-27 DIAGNOSIS — I2510 Atherosclerotic heart disease of native coronary artery without angina pectoris: Secondary | ICD-10-CM | POA: Diagnosis not present

## 2024-03-27 DIAGNOSIS — F17290 Nicotine dependence, other tobacco product, uncomplicated: Secondary | ICD-10-CM | POA: Diagnosis not present

## 2024-03-27 DIAGNOSIS — D62 Acute posthemorrhagic anemia: Secondary | ICD-10-CM | POA: Diagnosis not present

## 2024-03-27 DIAGNOSIS — G8929 Other chronic pain: Secondary | ICD-10-CM | POA: Diagnosis not present

## 2024-03-27 DIAGNOSIS — E8729 Other acidosis: Secondary | ICD-10-CM | POA: Diagnosis not present

## 2024-03-27 DIAGNOSIS — Z5986 Financial insecurity: Secondary | ICD-10-CM | POA: Diagnosis not present

## 2024-03-27 DIAGNOSIS — F17210 Nicotine dependence, cigarettes, uncomplicated: Secondary | ICD-10-CM | POA: Diagnosis not present

## 2024-03-27 DIAGNOSIS — Z87891 Personal history of nicotine dependence: Secondary | ICD-10-CM | POA: Diagnosis not present

## 2024-03-27 DIAGNOSIS — J948 Other specified pleural conditions: Secondary | ICD-10-CM | POA: Diagnosis not present

## 2024-03-27 DIAGNOSIS — I2585 Chronic coronary microvascular dysfunction: Secondary | ICD-10-CM | POA: Diagnosis not present

## 2024-03-27 DIAGNOSIS — Z7982 Long term (current) use of aspirin: Secondary | ICD-10-CM | POA: Diagnosis not present

## 2024-03-27 DIAGNOSIS — G8918 Other acute postprocedural pain: Secondary | ICD-10-CM | POA: Diagnosis not present

## 2024-03-27 DIAGNOSIS — R079 Chest pain, unspecified: Secondary | ICD-10-CM | POA: Diagnosis not present

## 2024-03-27 DIAGNOSIS — J9382 Other air leak: Secondary | ICD-10-CM | POA: Diagnosis not present

## 2024-03-27 DIAGNOSIS — I10 Essential (primary) hypertension: Secondary | ICD-10-CM | POA: Diagnosis not present

## 2024-03-27 DIAGNOSIS — Z4682 Encounter for fitting and adjustment of non-vascular catheter: Secondary | ICD-10-CM | POA: Diagnosis not present

## 2024-03-27 DIAGNOSIS — Z452 Encounter for adjustment and management of vascular access device: Secondary | ICD-10-CM | POA: Diagnosis not present

## 2024-03-27 DIAGNOSIS — J939 Pneumothorax, unspecified: Secondary | ICD-10-CM | POA: Diagnosis not present

## 2024-03-27 DIAGNOSIS — I340 Nonrheumatic mitral (valve) insufficiency: Secondary | ICD-10-CM | POA: Diagnosis not present

## 2024-03-27 DIAGNOSIS — J432 Centrilobular emphysema: Secondary | ICD-10-CM | POA: Diagnosis not present

## 2024-03-27 DIAGNOSIS — J90 Pleural effusion, not elsewhere classified: Secondary | ICD-10-CM | POA: Diagnosis not present

## 2024-03-27 SURGERY — LEFT HEART CATH AND CORONARY ANGIOGRAPHY
Anesthesia: Moderate Sedation | Laterality: Left

## 2024-03-27 MED ORDER — ASPIRIN 81 MG PO CHEW
CHEWABLE_TABLET | ORAL | Status: AC
  Filled 2024-03-27: qty 1

## 2024-03-27 MED ORDER — LIDOCAINE HCL 1 % IJ SOLN
INTRAMUSCULAR | Status: AC
  Filled 2024-03-27: qty 20

## 2024-03-27 MED ORDER — SODIUM CHLORIDE 0.9 % WEIGHT BASED INFUSION
3.0000 mL/kg/h | INTRAVENOUS | Status: AC
  Administered 2024-03-27: 3 mL/kg/h via INTRAVENOUS

## 2024-03-27 MED ORDER — SODIUM CHLORIDE 0.9% FLUSH: 3.0000 mL | Freq: Two times a day (BID) | INTRAVENOUS | Status: DC

## 2024-03-27 MED ORDER — SODIUM CHLORIDE 0.9 % IV SOLN: 250.0000 mL | INTRAVENOUS | Status: DC | PRN

## 2024-03-27 MED ORDER — IOHEXOL 300 MG/ML  SOLN
INTRAMUSCULAR | Status: DC | PRN
  Administered 2024-03-27: 50 mL

## 2024-03-27 MED ORDER — MIDAZOLAM HCL 2 MG/2ML IJ SOLN
INTRAMUSCULAR | Status: AC
Start: 2024-03-27 — End: ?
  Filled 2024-03-27: qty 2

## 2024-03-27 MED ORDER — BUPROPION HCL ER (XL) 150 MG PO TB24
150.0000 mg | ORAL_TABLET | Freq: Every day | ORAL | Status: DC
  Filled 2024-03-27: qty 1

## 2024-03-27 MED ORDER — SODIUM CHLORIDE 0.9% FLUSH: 3.0000 mL | INTRAVENOUS | Status: DC | PRN

## 2024-03-27 MED ORDER — ACETAMINOPHEN 325 MG PO TABS
650.0000 mg | ORAL_TABLET | ORAL | Status: DC | PRN
  Administered 2024-03-27: 650 mg via ORAL
  Filled 2024-03-27: qty 2

## 2024-03-27 MED ORDER — SODIUM CHLORIDE 0.9 % WEIGHT BASED INFUSION
1.0000 mL/kg/h | INTRAVENOUS | Status: DC
  Administered 2024-03-27: 1 mL/kg/h via INTRAVENOUS

## 2024-03-27 MED ORDER — HEPARIN SODIUM (PORCINE) 1000 UNIT/ML IJ SOLN
INTRAMUSCULAR | Status: AC
  Filled 2024-03-27: qty 10

## 2024-03-27 MED ORDER — HEPARIN (PORCINE) 25000 UT/250ML-% IV SOLN
900.0000 [IU]/h | INTRAVENOUS | Status: DC
  Administered 2024-03-27: 900 [IU]/h via INTRAVENOUS
  Filled 2024-03-27: qty 250

## 2024-03-27 MED ORDER — ONDANSETRON HCL 4 MG/2ML IJ SOLN: 4.0000 mg | Freq: Four times a day (QID) | INTRAMUSCULAR | Status: DC | PRN

## 2024-03-27 MED ORDER — VERAPAMIL HCL 2.5 MG/ML IV SOLN
INTRAVENOUS | Status: AC
  Filled 2024-03-27: qty 2

## 2024-03-27 MED ORDER — CYCLOBENZAPRINE HCL 10 MG PO TABS: 10.0000 mg | ORAL_TABLET | Freq: Three times a day (TID) | ORAL | Status: DC | PRN

## 2024-03-27 MED ORDER — ASPIRIN 81 MG PO CHEW: 81.0000 mg | CHEWABLE_TABLET | Freq: Every day | ORAL | Status: DC

## 2024-03-27 MED ORDER — FENTANYL CITRATE (PF) 100 MCG/2ML IJ SOLN
INTRAMUSCULAR | Status: DC | PRN
  Administered 2024-03-27 (×2): 50 ug via INTRAVENOUS

## 2024-03-27 MED ORDER — HYDRALAZINE HCL 20 MG/ML IJ SOLN: 10.0000 mg | INTRAMUSCULAR | Status: DC | PRN

## 2024-03-27 MED ORDER — AMLODIPINE BESYLATE 5 MG PO TABS: 2.5000 mg | ORAL_TABLET | Freq: Every day | ORAL | Status: DC

## 2024-03-27 MED ORDER — FENTANYL CITRATE (PF) 100 MCG/2ML IJ SOLN
INTRAMUSCULAR | Status: AC
  Filled 2024-03-27: qty 2

## 2024-03-27 MED ORDER — ASPIRIN 81 MG PO CHEW
81.0000 mg | CHEWABLE_TABLET | ORAL | Status: AC
  Administered 2024-03-27: 81 mg via ORAL

## 2024-03-27 MED ORDER — HEPARIN SODIUM (PORCINE) 1000 UNIT/ML IJ SOLN
INTRAMUSCULAR | Status: DC | PRN
  Administered 2024-03-27: 5000 [IU] via INTRAVENOUS

## 2024-03-27 MED ORDER — MIDAZOLAM HCL 2 MG/2ML IJ SOLN
INTRAMUSCULAR | Status: DC | PRN
  Administered 2024-03-27 (×2): 1 mg via INTRAVENOUS

## 2024-03-27 MED ORDER — NITROGLYCERIN IN D5W 200-5 MCG/ML-% IV SOLN
0.0000 ug/min | INTRAVENOUS | Status: DC
Start: 2024-03-27 — End: 2024-03-27

## 2024-03-27 MED ORDER — HEPARIN (PORCINE) IN NACL 1000-0.9 UT/500ML-% IV SOLN
INTRAVENOUS | Status: DC | PRN
  Administered 2024-03-27: 1000 mL

## 2024-03-27 MED ORDER — HEPARIN (PORCINE) IN NACL 1000-0.9 UT/500ML-% IV SOLN
INTRAVENOUS | Status: AC
Start: 2024-03-27 — End: ?
  Filled 2024-03-27: qty 1000

## 2024-03-27 MED ORDER — SODIUM CHLORIDE 0.9 % WEIGHT BASED INFUSION
1.0000 mL/kg/h | INTRAVENOUS | Status: DC
Start: 2024-03-27 — End: 2024-03-27

## 2024-03-27 MED ORDER — LIDOCAINE HCL (PF) 1 % IJ SOLN
INTRAMUSCULAR | Status: DC | PRN
  Administered 2024-03-27: 2 mL

## 2024-03-27 MED ORDER — NITROGLYCERIN IN D5W 200-5 MCG/ML-% IV SOLN
INTRAVENOUS | Status: AC
  Administered 2024-03-27: 5 ug/min via INTRAVENOUS
  Filled 2024-03-27: qty 250

## 2024-03-27 MED ORDER — VERAPAMIL HCL 2.5 MG/ML IV SOLN: INTRAVENOUS | Status: DC | PRN

## 2024-03-27 SURGICAL SUPPLY — 9 items
CATH INFINITI 5 FR JL3.5 (CATHETERS) IMPLANT
CATH INFINITI JR4 5F (CATHETERS) IMPLANT
DEVICE RAD TR BAND REGULAR (VASCULAR PRODUCTS) IMPLANT
DRAPE BRACHIAL (DRAPES) IMPLANT
GLIDESHEATH SLEND A-KIT 6F 22G (SHEATH) IMPLANT
GUIDEWIRE INQWIRE 1.5J.035X260 (WIRE) IMPLANT
PACK CARDIAC CATH (CUSTOM PROCEDURE TRAY) ×1 IMPLANT
SET ATX-X65L (MISCELLANEOUS) IMPLANT
STATION PROTECTION PRESSURIZED (MISCELLANEOUS) IMPLANT

## 2024-03-27 NOTE — Progress Notes (Signed)
 PHARMACY - ANTICOAGULATION CONSULT NOTE  Pharmacy Consult for heparin Indication: chest pain/ACS  Allergies  Allergen Reactions   Escitalopram     Felt weird   Sertraline      Crazy thoughts    Patient Measurements: Height: 6\' 1"  (185.4 cm) Weight: 78.5 kg (173 lb) IBW/kg (Calculated) : 79.9 HEPARIN DW (KG): 78.5  Vital Signs: Temp: 98.1 F (36.7 C) (05/07 1053) Temp Source: Oral (05/07 1053) BP: 121/74 (05/07 1330) Pulse Rate: 69 (05/07 1330)  Labs: No results for input(s): "HGB", "HCT", "PLT", "APTT", "LABPROT", "INR", "HEPARINUNFRC", "HEPRLOWMOCWT", "CREATININE", "CKTOTAL", "CKMB", "TROPONINIHS" in the last 72 hours.  Estimated Creatinine Clearance: 96.6 mL/min (by C-G formula based on SCr of 1.05 mg/dL).   Medical History: Past Medical History:  Diagnosis Date   Spinal stenosis    Assessment: 48 y/o male presenting for outpatient left hearth catheterization. 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 Pharmacy has been consulted to initiate heparin infusion. Per chart review, patient is not on anticoagulation prior to admission. Patient has orders to transfer to [facility] for urgent bypass surgery.  Baseline labs: hgb 13.2, plt 347, INR pending  Per protocol, will start heparin infusion 2 hours post-band removal. Per RN, band removed 03/27/24 at 1345.  Goal of Therapy:  Heparin level 0.3-0.7 units/ml Monitor platelets by anticoagulation protocol: Yes   Plan:  At 1545, start heparin infusion at 900 units/hour without bolus given recent heart catheterization Check 6 hour heparin level Monitor daily heparin level while on heparin infusion Monitor CBC and signs/symptoms of bleeding  Thank you for involving pharmacy in this patient's care.   Ananias Balls, PharmD Clinical Pharmacist 03/27/2024 1:56 PM

## 2024-03-27 NOTE — Discharge Summary (Addendum)
 [facility] CLINIC CARDIOLOGY DISCHARGE SUMMARY       Patient ID: Parker Gomez MRN: [id number] DOB/AGE: [birth year] 48 y.o.  Admit date: 03/27/2024 Referring Physician None - outpatient cath being admitted Primary Physician Cable, Hoy Mackintosh, NP  Primary Cardiologist Dr. Bob Burn Reason for Consultation Unstable angina  HPI: Parker Gomez is a 48 y.o. male  with a past medical history of GERD, hypertension, hyperlipidemia, erectile dysfunction who came to [facility] today for outpatient left heart catheterization on 03/27/2024 for unstable angina.  Found to have severe left main disease on heart catheterization.  Patient will be admitted, and ultimately transferred to [facility] for bypass surgery.  Patient reports that over the last few months he has had progressively worsening chest pain symptoms.  States that anytime he exerts himself or even uses his arms and legs he starts to feel very fatigued and has chest pain.  Symptoms resolved with rest.  He is now having symptoms at rest at times.  States that he did have some chest pain when he first came into the Cath Lab prior to his heart catheterization but this has not resolved.  Left heart cath was done by Dr. Philippa Bray and revealed significant left main lesion.  Patient remains hemodynamically stable with controlled blood pressure and heart rate, no chest pain following heart catheterization.  EKG without acute ischemic changes.  The patient was brought to the cardiac cath lab and underwent left heart catheterization and coronary angiography with Dr. Philippa Bray on 03/27/24. The patient tolerated with procedure well without complications.  The left wrist access site was examined and found to be without significant erythema, tenderness to palpation, or apparent aneurysm. Hospital course was overall uneventful, patient stable for transfer to [facility] for CABG. Will arrange for follow up in office in 1 week of discharge from [facility], or sooner if needed.     Review of systems complete and  found to be negative unless listed above    Past Medical History:  Diagnosis Date   Spinal stenosis     Past Surgical History:  Procedure Laterality Date   BACK SURGERY     CERVICAL SPINE SURGERY  2020   COLONOSCOPY WITH PROPOFOL  N/A 08/25/2022   Procedure: COLONOSCOPY WITH PROPOFOL ;  Surgeon: Selena Daily, MD;  Location: [facility] ENDOSCOPY;  Service: Gastroenterology;  Laterality: N/A;    Medications Prior to Admission  Medication Sig Dispense Refill Last Dose/Taking   albuterol  (VENTOLIN  HFA) 108 (90 Base) MCG/ACT inhaler Inhale 1-2 puffs into the lungs every 6 (six) hours as needed for shortness of breath or wheezing.   Taking As Needed   amLODipine (NORVASC) 5 MG tablet Take 2.5 mg by mouth daily.   03/26/2024   aspirin EC 81 MG tablet Take 81 mg by mouth daily.   03/26/2024   buPROPion  (WELLBUTRIN  XL) 150 MG 24 hr tablet Take 1 tablet (150 mg total) by mouth daily. 90 tablet 1 03/26/2024   cyclobenzaprine  (FLEXERIL ) 10 MG tablet Take 1 tablet (10 mg total) by mouth 3 (three) times daily as needed for muscle spasms. 30 tablet 0 Past Month   fluticasone (FLONASE) 50 MCG/ACT nasal spray Place 2 sprays into both nostrils daily as needed for allergies.   Taking As Needed   pantoprazole (PROTONIX) 40 MG tablet Take 1 tablet (40 mg total) by mouth daily. 90 tablet 0 03/26/2024   tadalafil  (CIALIS ) 10 MG tablet TAKE 1 TABLET (10 MG TOTAL) BY MOUTH EVERY OTHER DAY AS NEEDED FOR ERECTILE DYSFUNCTION. 6 tablet 3 Past Week  hydrOXYzine (ATARAX) 10 MG tablet Take 1 tablet (10 mg total) by mouth 3 (three) times daily as needed. 30 tablet 0    Social History   Socioeconomic History   Marital status: Married    Spouse name: Not on file   Number of children: 2   Years of education: Not on file   Highest education level: 12th grade  Occupational History   Not on file  Tobacco Use   Smoking status: Former    Current packs/day: 0.00    Average packs/day: 1 pack/day for 30.0 years (30.0 ttl pk-yrs)     Types: Cigarettes    Start date: 07/12/1992    Quit date: 07/12/2022    Years since quitting: 1.7   Smokeless tobacco: Never  Vaping Use   Vaping status: Some Days  Substance and Sexual Activity   Alcohol use: Yes    Comment: occasionally, stop since smoking. Once a month   Drug use: No   Sexual activity: Not on file  Other Topics Concern   Not on file  Social History Narrative   1 step and 1 bio      [employer]times   Social Drivers of Health   Financial Resource Strain: Medium Risk (03/21/2024)   Received from [facility]   Overall Financial Resource Strain (CARDIA)    Difficulty of Paying Living Expenses: Somewhat hard  Food Insecurity: No Food Insecurity (03/21/2024)   Received from [facility]   Hunger Vital Sign    Worried About Running Out of Food in the Last Year: Never true    Ran Out of Food in the Last Year: Never true  Transportation Needs: No Transportation Needs (03/21/2024)   Received from [facility] - Transportation    In the past 12 months, has lack of transportation kept you from medical appointments or from getting medications?: No    Lack of Transportation (Non-Medical): No  Physical Activity: Unknown (01/01/2024)   Exercise Vital Sign    Days of Exercise per Week: 0 days    Minutes of Exercise per Session: Not on file  Stress: No Stress Concern Present (01/01/2024)   [employer] of Occupational Health - Occupational Stress Questionnaire    Feeling of Stress : Only a little  Social Connections: Moderately Isolated (01/01/2024)   Social Connection and Isolation Panel [NHANES]    Frequency of Communication with Friends and Family: Three times a week    Frequency of Social Gatherings with Friends and Family: Once a week    Attends Religious Services: Never    Database administrator or Organizations: No    Attends Engineer, structural: Not on file    Marital Status: Married   Catering manager Violence: Not At Risk (03/26/2024)   Received from [facility]   HITS    Over the last 12 months how often did your partner physically hurt you?: Never    Over the last 12 months how often did your partner insult you or talk down to you?: Never    Over the last 12 months how often did your partner threaten you with physical harm?: Never    Over the last 12 months how often did your partner scream or curse at you?: Never    Family History  Problem Relation Age of Onset   COPD Father    Frontotemporal dementia Father    Lung cancer Maternal Grandmother    Throat cancer Maternal Grandfather  Ovarian cancer Paternal Grandmother    Dementia Paternal Grandfather    Heart attack Paternal Grandfather      Vitals:   03/27/24 1215 03/27/24 1230 03/27/24 1245 03/27/24 1300  BP: 121/85 118/64 124/71 113/65  Pulse: (!) 56 63 65 (!) 59  Resp: 19 17 13 14   Temp:      TempSrc:      SpO2: 99% 98% 99% 98%  Weight:      Height:        PHYSICAL EXAM General: Well-appearing male, well nourished, in no acute distress. HEENT: Normocephalic and atraumatic. Neck: No JVD. Lungs: Normal respiratory effort on room air. Clear bilaterally to auscultation. No wheezes, crackles, rhonchi.  Heart: HRRR. Normal S1 and S2 without gallops or murmurs.  Abdomen: Non-distended appearing.  Msk: Normal strength and tone for age. Extremities: Warm and well perfused. No clubbing, cyanosis. No edema.  Neuro: Alert and oriented X 3. Psych: Answers questions appropriately.   Labs: Basic Metabolic Panel: No results for input(s): "NA", "K", "CL", "CO2", "GLUCOSE", "BUN", "CREATININE", "CALCIUM", "MG", "PHOS" in the last 72 hours. Liver Function Tests: No results for input(s): "AST", "ALT", "ALKPHOS", "BILITOT", "PROT", "ALBUMIN" in the last 72 hours. No results for input(s): "LIPASE", "AMYLASE" in the last 72 hours. CBC: No results for input(s): "WBC", "NEUTROABS", "HGB", "HCT", "MCV", "PLT" in  the last 72 hours. Cardiac Enzymes: No results for input(s): "CKTOTAL", "CKMB", "CKMBINDEX", "TROPONINIHS" in the last 72 hours. BNP: No results for input(s): "BNP" in the last 72 hours. D-Dimer: No results for input(s): "DDIMER" in the last 72 hours. Hemoglobin A1C: No results for input(s): "HGBA1C" in the last 72 hours. Fasting Lipid Panel: No results for input(s): "CHOL", "HDL", "LDLCALC", "TRIG", "CHOLHDL", "LDLDIRECT" in the last 72 hours. Thyroid  Function Tests: No results for input(s): "TSH", "T4TOTAL", "T3FREE", "THYROIDAB" in the last 72 hours.  Invalid input(s): "FREET3" Anemia Panel: No results for input(s): "VITAMINB12", "FOLATE", "FERRITIN", "TIBC", "IRON", "RETICCTPCT" in the last 72 hours.   Radiology: CARDIAC CATHETERIZATION Result Date: 03/27/2024   Ost LM lesion is 95% stenosed. 1.  Severe ostial LMCA disease 2.  Transfer to [facility] for urgent bypass surgery evaluation   DG Chest 2 View Result Date: 03/19/2024 CLINICAL DATA:  Chest pain. EXAM: CHEST - 2 VIEW COMPARISON:  02/15/2024. FINDINGS: Bilateral lung fields are clear. Bilateral costophrenic angles are clear. Normal cardio-mediastinal silhouette. No acute osseous abnormalities. The soft tissues are within normal limits. IMPRESSION: No active cardiopulmonary disease. Electronically Signed   By: Beula Brunswick M.D.   On: 03/19/2024 14:45    ECHO will defer, to be done at [facility] reviewed by me 03/27/2024: sinus rhythm rate   EKG reviewed by me: Normal sinus rhythm rate 60 bpm  Data reviewed by me 03/27/2024: last 24h vitals tele labs imaging I/O cath notes, nursing notes  Principal Problem:   Unstable angina (HCC)    ASSESSMENT AND PLAN:  Kveon Decrescenzo is a 48 y.o. male  with a past medical history of GERD, hypertension, hyperlipidemia, erectile dysfunction who came to [facility] today for outpatient left heart catheterization on 03/27/2024 for unstable angina.  Found to have severe left main disease on heart  catheterization.  Patient will be admitted, and ultimately transferred to [facility] for bypass surgery.  # Unstable angina # Coronary artery disease with significant LM lesion Patient with worsening unstable angina symptoms presented today for outpatient heart catheterization and found to have significant left main disease. -Echo to be obtained once patient has arrived at  [facility]. -Will start patient on IV heparin 2 hours post-band removal. -Will start IV nitroglycerin  given continued chest pain.  -Resume home aspirin 81 mg daily. -Resume home amlodipine 2.5 mg daily. -Also resume home Flexeril  and Wellbutrin .  Time spent with patient: 51 MINUTES  Patient is clinically stable for transfer to [facility] today for urgent bypass surgery.  Patient to follow-up with Dr. Bob Burn 1 week post-discharge from [facility]  This patient's plan of care was discussed and created with Dr. Bob Burn and he is in agreement.  Signed: Hamp Levine, PA-C  03/27/2024, 1:16 PM [facility] Cardiology

## 2024-03-28 ENCOUNTER — Telehealth: Payer: Self-pay | Admitting: Nurse Practitioner

## 2024-03-28 DIAGNOSIS — Z01818 Encounter for other preprocedural examination: Secondary | ICD-10-CM | POA: Diagnosis not present

## 2024-03-28 DIAGNOSIS — I25119 Atherosclerotic heart disease of native coronary artery with unspecified angina pectoris: Secondary | ICD-10-CM | POA: Diagnosis not present

## 2024-03-28 DIAGNOSIS — J432 Centrilobular emphysema: Secondary | ICD-10-CM | POA: Diagnosis not present

## 2024-03-28 DIAGNOSIS — F17200 Nicotine dependence, unspecified, uncomplicated: Secondary | ICD-10-CM | POA: Diagnosis not present

## 2024-03-28 NOTE — Telephone Encounter (Signed)
 Got an [employer] form in my box for him. Is this for him to be out due to the heart cath?

## 2024-03-29 DIAGNOSIS — I25119 Atherosclerotic heart disease of native coronary artery with unspecified angina pectoris: Secondary | ICD-10-CM | POA: Diagnosis not present

## 2024-03-29 DIAGNOSIS — I25110 Atherosclerotic heart disease of native coronary artery with unstable angina pectoris: Secondary | ICD-10-CM | POA: Diagnosis not present

## 2024-03-29 DIAGNOSIS — Z4682 Encounter for fitting and adjustment of non-vascular catheter: Secondary | ICD-10-CM | POA: Diagnosis not present

## 2024-03-29 DIAGNOSIS — R918 Other nonspecific abnormal finding of lung field: Secondary | ICD-10-CM | POA: Diagnosis not present

## 2024-03-29 DIAGNOSIS — I2585 Chronic coronary microvascular dysfunction: Secondary | ICD-10-CM | POA: Diagnosis not present

## 2024-03-29 DIAGNOSIS — Z951 Presence of aortocoronary bypass graft: Secondary | ICD-10-CM | POA: Diagnosis not present

## 2024-03-29 DIAGNOSIS — Z452 Encounter for adjustment and management of vascular access device: Secondary | ICD-10-CM | POA: Diagnosis not present

## 2024-03-29 NOTE — Telephone Encounter (Signed)
 Can we call cardiology office and have them fill the form out since they are doing the procedure and know how long the patient should be out of work

## 2024-03-29 NOTE — Telephone Encounter (Addendum)
 Spoke with pt's wife, Lauen (on drp), asking about [employer] form. States form is for short term disability. Says pt has procedure today and will need to be out of work about 3 mos.   Also, Barbra Ley asks that she be contacted on cell # [phone] concerning pt for the next few months.

## 2024-03-30 DIAGNOSIS — I25119 Atherosclerotic heart disease of native coronary artery with unspecified angina pectoris: Secondary | ICD-10-CM | POA: Diagnosis not present

## 2024-03-30 DIAGNOSIS — Z4682 Encounter for fitting and adjustment of non-vascular catheter: Secondary | ICD-10-CM | POA: Diagnosis not present

## 2024-03-30 DIAGNOSIS — G8918 Other acute postprocedural pain: Secondary | ICD-10-CM | POA: Diagnosis not present

## 2024-03-30 DIAGNOSIS — J9622 Acute and chronic respiratory failure with hypercapnia: Secondary | ICD-10-CM | POA: Diagnosis not present

## 2024-03-30 DIAGNOSIS — R918 Other nonspecific abnormal finding of lung field: Secondary | ICD-10-CM | POA: Diagnosis not present

## 2024-03-30 DIAGNOSIS — Z452 Encounter for adjustment and management of vascular access device: Secondary | ICD-10-CM | POA: Diagnosis not present

## 2024-03-30 DIAGNOSIS — F17210 Nicotine dependence, cigarettes, uncomplicated: Secondary | ICD-10-CM | POA: Diagnosis not present

## 2024-03-30 DIAGNOSIS — D62 Acute posthemorrhagic anemia: Secondary | ICD-10-CM | POA: Diagnosis not present

## 2024-03-31 DIAGNOSIS — J90 Pleural effusion, not elsewhere classified: Secondary | ICD-10-CM | POA: Diagnosis not present

## 2024-03-31 DIAGNOSIS — Z452 Encounter for adjustment and management of vascular access device: Secondary | ICD-10-CM | POA: Diagnosis not present

## 2024-03-31 DIAGNOSIS — Z4682 Encounter for fitting and adjustment of non-vascular catheter: Secondary | ICD-10-CM | POA: Diagnosis not present

## 2024-03-31 DIAGNOSIS — I2585 Chronic coronary microvascular dysfunction: Secondary | ICD-10-CM | POA: Diagnosis not present

## 2024-03-31 DIAGNOSIS — R918 Other nonspecific abnormal finding of lung field: Secondary | ICD-10-CM | POA: Diagnosis not present

## 2024-03-31 DIAGNOSIS — I25119 Atherosclerotic heart disease of native coronary artery with unspecified angina pectoris: Secondary | ICD-10-CM | POA: Diagnosis not present

## 2024-03-31 DIAGNOSIS — J939 Pneumothorax, unspecified: Secondary | ICD-10-CM | POA: Diagnosis not present

## 2024-04-01 DIAGNOSIS — I25119 Atherosclerotic heart disease of native coronary artery with unspecified angina pectoris: Secondary | ICD-10-CM | POA: Diagnosis not present

## 2024-04-01 DIAGNOSIS — J939 Pneumothorax, unspecified: Secondary | ICD-10-CM | POA: Diagnosis not present

## 2024-04-01 DIAGNOSIS — J9811 Atelectasis: Secondary | ICD-10-CM | POA: Diagnosis not present

## 2024-04-01 DIAGNOSIS — J90 Pleural effusion, not elsewhere classified: Secondary | ICD-10-CM | POA: Diagnosis not present

## 2024-04-02 DIAGNOSIS — J939 Pneumothorax, unspecified: Secondary | ICD-10-CM | POA: Diagnosis not present

## 2024-04-02 DIAGNOSIS — J9811 Atelectasis: Secondary | ICD-10-CM | POA: Diagnosis not present

## 2024-04-02 DIAGNOSIS — I25119 Atherosclerotic heart disease of native coronary artery with unspecified angina pectoris: Secondary | ICD-10-CM | POA: Diagnosis not present

## 2024-04-02 DIAGNOSIS — Z4682 Encounter for fitting and adjustment of non-vascular catheter: Secondary | ICD-10-CM | POA: Diagnosis not present

## 2024-04-02 DIAGNOSIS — Z951 Presence of aortocoronary bypass graft: Secondary | ICD-10-CM | POA: Diagnosis not present

## 2024-04-02 DIAGNOSIS — Z452 Encounter for adjustment and management of vascular access device: Secondary | ICD-10-CM | POA: Diagnosis not present

## 2024-04-02 DIAGNOSIS — R918 Other nonspecific abnormal finding of lung field: Secondary | ICD-10-CM | POA: Diagnosis not present

## 2024-04-02 NOTE — Telephone Encounter (Signed)
 Left message to return call to our office.

## 2024-04-03 DIAGNOSIS — Z4682 Encounter for fitting and adjustment of non-vascular catheter: Secondary | ICD-10-CM | POA: Diagnosis not present

## 2024-04-03 DIAGNOSIS — Z951 Presence of aortocoronary bypass graft: Secondary | ICD-10-CM | POA: Diagnosis not present

## 2024-04-03 DIAGNOSIS — J939 Pneumothorax, unspecified: Secondary | ICD-10-CM | POA: Diagnosis not present

## 2024-04-03 DIAGNOSIS — R918 Other nonspecific abnormal finding of lung field: Secondary | ICD-10-CM | POA: Diagnosis not present

## 2024-04-04 DIAGNOSIS — Z951 Presence of aortocoronary bypass graft: Secondary | ICD-10-CM | POA: Diagnosis not present

## 2024-04-04 DIAGNOSIS — R918 Other nonspecific abnormal finding of lung field: Secondary | ICD-10-CM | POA: Diagnosis not present

## 2024-04-04 DIAGNOSIS — J939 Pneumothorax, unspecified: Secondary | ICD-10-CM | POA: Diagnosis not present

## 2024-04-04 DIAGNOSIS — J948 Other specified pleural conditions: Secondary | ICD-10-CM | POA: Diagnosis not present

## 2024-04-05 DIAGNOSIS — J948 Other specified pleural conditions: Secondary | ICD-10-CM | POA: Diagnosis not present

## 2024-04-05 DIAGNOSIS — Z4682 Encounter for fitting and adjustment of non-vascular catheter: Secondary | ICD-10-CM | POA: Diagnosis not present

## 2024-04-05 DIAGNOSIS — Z951 Presence of aortocoronary bypass graft: Secondary | ICD-10-CM | POA: Diagnosis not present

## 2024-04-05 DIAGNOSIS — R918 Other nonspecific abnormal finding of lung field: Secondary | ICD-10-CM | POA: Diagnosis not present

## 2024-04-06 DIAGNOSIS — J90 Pleural effusion, not elsewhere classified: Secondary | ICD-10-CM | POA: Diagnosis not present

## 2024-04-06 DIAGNOSIS — Z951 Presence of aortocoronary bypass graft: Secondary | ICD-10-CM | POA: Diagnosis not present

## 2024-04-06 DIAGNOSIS — J939 Pneumothorax, unspecified: Secondary | ICD-10-CM | POA: Diagnosis not present

## 2024-04-06 DIAGNOSIS — R918 Other nonspecific abnormal finding of lung field: Secondary | ICD-10-CM | POA: Diagnosis not present

## 2024-04-06 DIAGNOSIS — J9811 Atelectasis: Secondary | ICD-10-CM | POA: Diagnosis not present

## 2024-04-07 DIAGNOSIS — J948 Other specified pleural conditions: Secondary | ICD-10-CM | POA: Diagnosis not present

## 2024-04-07 DIAGNOSIS — J939 Pneumothorax, unspecified: Secondary | ICD-10-CM | POA: Diagnosis not present

## 2024-04-07 DIAGNOSIS — Z951 Presence of aortocoronary bypass graft: Secondary | ICD-10-CM | POA: Diagnosis not present

## 2024-04-07 DIAGNOSIS — R918 Other nonspecific abnormal finding of lung field: Secondary | ICD-10-CM | POA: Diagnosis not present

## 2024-04-08 ENCOUNTER — Telehealth: Payer: Self-pay | Admitting: *Deleted

## 2024-04-08 NOTE — Transitions of Care (Post Inpatient/ED Visit) (Signed)
 04/08/2024  Name: Parker Gomez MRN: [id number] DOB: 08-28-1976  Today's TOC FU Call Status: Today's TOC FU Call Status:: Successful TOC FU Call Completed TOC FU Call Complete Date: 04/08/24 Patient's Name and Date of Birth confirmed.  Transition Care Management Follow-up Telephone Call Date of Discharge: 04/07/24 Discharge Facility: Other ([facility]) Name of Other ([facility]) Discharge Facility: [facility] Type of Discharge: Inpatient Admission Primary Inpatient Discharge Diagnosis:: CAD with cabg x 2 How have you been since you were released from the hospital?: Better Any questions or concerns?: Yes (I am having some soreness in my shoulder) Patient Questions/Concerns:: Patient is having soreness in shoulder Patient Questions/Concerns Addressed: Other: (He is going to talk with the Dr 05/20 at visit/RN offered to give him the number to talk with [facility] specialist but he declined)  Items Reviewed: Did you receive and understand the discharge instructions provided?: Yes Medications obtained,verified, and reconciled?: Yes (Medications Reviewed) Any new allergies since your discharge?: No Dietary orders reviewed?: No Do you have support at home?: Yes People in Home [RPT]: spouse Name of Support/Comfort Primary Source: Laurean  Medications Reviewed Today: Medications Reviewed Today     Reviewed by Eilene Grater, RN (Case Manager) on 04/08/24 at 1529  Med List Status: <None>   Medication Order Taking? Sig Documenting Provider Last Dose Status Informant  Acetaminophen  325 MG CAPS [id number] Yes Take 650 mg by mouth every 6 (six) hours as needed (for Pain for up to 10 days). [provider] Taking Active   albuterol  (VENTOLIN  HFA) 108 (90 Base) MCG/ACT inhaler [id number] Yes Inhale 1-2 puffs into the lungs every 6 (six) hours as needed for shortness of breath or wheezing. [provider] Taking Active Self  amLODipine  (NORVASC ) 5 MG tablet [id number] Yes Take 2.5  mg by mouth daily. [provider] Taking Active Self  aspirin  EC 81 MG tablet [id number] Yes Take 81 mg by mouth daily. [provider] Taking Active Self  atorvastatin (LIPITOR) 80 MG tablet [id number] Yes Take 80 mg by mouth daily. [provider] Taking Active   buPROPion  (WELLBUTRIN  XL) 150 MG 24 hr tablet [id number] Yes Take 1 tablet (150 mg total) by mouth daily. Dorothe Gaster, NP Taking Active Self  cyclobenzaprine  (FLEXERIL ) 10 MG tablet [id number] Yes Take 1 tablet (10 mg total) by mouth 3 (three) times daily as needed for muscle spasms. Dorothe Gaster, NP Taking Active   fluticasone [facility]) 50 MCG/ACT nasal spray [id number] Yes Place 2 sprays into both nostrils daily as needed for allergies. [provider] Taking Active Self  furosemide (LASIX) 20 MG tablet [id number] Yes Take 20 mg by mouth daily. [provider] Taking Active            Med Note Jeana Michaels Apr 08, 2024  3:12 PM)  Please take potassium with lasix. If you change/ stop taking lasix dose, change/stop taking potassium dose.    hydrOXYzine  (ATARAX ) 10 MG tablet [id number] Yes Take 1 tablet (10 mg total) by mouth 3 (three) times daily as needed. Dorothe Gaster, NP Taking Active   metoprolol tartrate (LOPRESSOR) 25 MG tablet [id number] Yes Take 25 mg by mouth 2 (two) times daily. [provider] Taking Active   oxyCODONE  (OXY IR/ROXICODONE ) 5 MG immediate release tablet [id number] Yes Take 5 mg by mouth every 6 (six) hours as needed for severe pain (pain score 7-10) (for Pain for up to 7 days). [provider] Taking Active   pantoprazole  (PROTONIX ) 40  MG tablet [id number] Yes Take 1 tablet (40 mg total) by mouth daily. Dorothe Gaster, NP Taking Active   polyethylene glycol (MIRALAX / GLYCOLAX) 17 g packet [id number] Yes Take 17 g by mouth 2 (two) times daily. Mix in 4-8ounces of fluid prior to taking [provider] Taking Active   potassium  chloride SA (KLOR-CON M) 20 MEQ tablet [id number] Yes Take 20 mEq by mouth 2 (two) times daily. [provider] Taking Active   sennosides-docusate sodium (SENOKOT-S) 8.6-50 MG tablet [id number] Yes Take 2 tablets by mouth 2 (two) times daily. [provider] Taking Active   Sennosides-Docusate Sodium 8.6-50 MG CAPS [id number] Yes Take 2 tablets by mouth 2 (two) times daily. [provider] Taking Active   tadalafil  (CIALIS ) 10 MG tablet [id number] Yes TAKE 1 TABLET (10 MG TOTAL) BY MOUTH EVERY OTHER DAY AS NEEDED FOR ERECTILE DYSFUNCTION. Dorothe Gaster, NP Taking Active Self            Home Care and Equipment/Supplies: Were Home Health Services Ordered?: NA Any new equipment or medical supplies ordered?: NA  Functional Questionnaire: Do you need assistance with bathing/showering or dressing?: No Do you need assistance with meal preparation?: Yes Do you need assistance with eating?: No Do you have difficulty maintaining continence: No Do you need assistance with getting out of bed/getting out of a chair/moving?: Yes (soreness/ usinmg a rollator) Do you have difficulty managing or taking your medications?: No  Follow up appointments reviewed: PCP Follow-up appointment confirmed?: Yes Date of PCP follow-up appointment?: 04/17/24 Follow-up Provider: Winthrop Hawks NP Specialist Hospital Follow-up appointment confirmed?: Yes Date of Specialist follow-up appointment?: 04/09/24 Follow-Up Specialty Provider:: Dr Alluri/ [id number] Dr Jola Nash berger/ televisit with Jo Mouse Do you need transportation to your follow-up appointment?: No Do you understand care options if your condition(s) worsen?: Yes-patient verbalized understanding  SDOH Interventions Today    Flowsheet Row Most Recent Value  SDOH Interventions   Food Insecurity Interventions Intervention Not Indicated  Housing Interventions Intervention Not Indicated  Transportation Interventions Intervention  Not Indicated, Patient Resources (Friends/Family)  Utilities Interventions Intervention Not Indicated       Goals Addressed             This Visit's Progress    VBCI Transitions of Care (TOC) Care Plan       Problems:  Recent Hospitalization for treatment of CAD SDOH barrier: difficulty paying mortgage/rent  Goal:  Over the next 30 days, the patient will not experience hospital readmission  Interventions:   CAD Interventions: Assessed understanding of CAD diagnosis Medications reviewed including medications utilized in CAD treatment plan Reviewed Importance of taking all medications as prescribed Reviewed Importance of attending all scheduled provider appointments Advised to report any changes in symptoms or exercise tolerance Advised patient to discuss pain in upper shoulder with provider  Patient Self Care Activities:  Attend all scheduled provider appointments Call pharmacy for medication refills 3-7 days in advance of running out of medications Call provider office for new concerns or questions  Notify RN Care Manager of TOC call rescheduling needs Participate in Transition of Care Program/Attend TOC scheduled calls Take medications as prescribed    Plan:  An initial telephone outreach has been scheduled for: [id number] Winthrop Hawks 3:20 Next PCP appointment scheduled for: [id number] Telephone follow up appointment with care management team member scheduled for:  [id number] [place] 10:15      ;  Una Ganser BSN RN [facility] Care Management Coordinator [email or web address] Direct  Dial: [phone]  Fax: [phone] Website: [email or web address]

## 2024-04-09 DIAGNOSIS — I2510 Atherosclerotic heart disease of native coronary artery without angina pectoris: Secondary | ICD-10-CM | POA: Diagnosis not present

## 2024-04-09 DIAGNOSIS — F4323 Adjustment disorder with mixed anxiety and depressed mood: Secondary | ICD-10-CM | POA: Diagnosis not present

## 2024-04-09 DIAGNOSIS — Z720 Tobacco use: Secondary | ICD-10-CM | POA: Diagnosis not present

## 2024-04-09 DIAGNOSIS — Z951 Presence of aortocoronary bypass graft: Secondary | ICD-10-CM | POA: Diagnosis not present

## 2024-04-09 NOTE — Telephone Encounter (Signed)
 Contacted cardiology office and will fax form to [phone] number provided.

## 2024-04-17 ENCOUNTER — Other Ambulatory Visit: Payer: Self-pay

## 2024-04-17 ENCOUNTER — Ambulatory Visit (INDEPENDENT_AMBULATORY_CARE_PROVIDER_SITE_OTHER): Admitting: Nurse Practitioner

## 2024-04-17 ENCOUNTER — Telehealth: Payer: Self-pay

## 2024-04-17 VITALS — BP 98/60 | HR 69 | Temp 97.9°F | Ht 73.0 in | Wt 180.8 lb

## 2024-04-17 DIAGNOSIS — R634 Abnormal weight loss: Secondary | ICD-10-CM | POA: Diagnosis not present

## 2024-04-17 DIAGNOSIS — Z951 Presence of aortocoronary bypass graft: Secondary | ICD-10-CM | POA: Diagnosis not present

## 2024-04-17 DIAGNOSIS — F411 Generalized anxiety disorder: Secondary | ICD-10-CM

## 2024-04-17 NOTE — Patient Instructions (Signed)
 Visit Information  Thank you for taking time to visit with me today. Please don't hesitate to contact me if I can be of assistance to you before our next scheduled telephone appointment.  Our next appointment is by telephone on April 26, 2024 at 10:00 am  Following is a copy of your care plan:   Goals Addressed             This Visit's Progress    VBCI Transitions of Care (TOC) Care Plan   On track    Problems:  Recent Hospitalization for treatment of CAD Reviewed CABG and sternal precautions and S/S of infection SDOH barrier: difficulty paying mortgage/rent - recommended social work assistance, decline but to discuss next week's appointment if Child psychotherapist is needed Goal:  Over the next 30 days, the patient will not experience hospital readmission  Interventions:   CAD Interventions: Assessed understanding of CAD diagnosis Medications reviewed including medications utilized in CAD treatment plan Reviewed Importance of taking all medications as prescribed Reviewed Importance of attending all scheduled provider appointments Advised to report any changes in symptoms or exercise tolerance Advised patient to discuss pain in upper shoulder with provider  Patient Self Care Activities:  Attend all scheduled provider appointments Call pharmacy for medication refills 3-7 days in advance of running out of medications Call provider office for new concerns or questions  Notify RN Care Manager of TOC call rescheduling needs Participate in Transition of Care Program/Attend TOC scheduled calls Take medications as prescribed   Not smoking since hospitalization, and not using nicotine patches currently, has some availble if needed though.  Plan:  An initial telephone outreach has been scheduled for: [phone] Winthrop Hawks 3:20 pm Next PCP appointment scheduled for: [id number] Telephone follow up appointment with care management team member scheduled for:  04/26/2024 at 10:00 am [place]  10:15        Patient verbalizes understanding of instructions and care plan provided today and agrees to view in MyChart. Active MyChart status and patient understanding of how to access instructions and care plan via MyChart confirmed with patient.     Telephone follow up appointment with care management team member scheduled for: The patient has been provided with contact information for the care management team and has been advised to call with any health related questions or concerns.  The care management team will reach out to the patient again over the next 7 -10 business days.   Please call the care guide team at [phone] if you need to cancel or reschedule your appointment.   Please call the [place]  National Suicide Prevention Lifeline: 306-081-8221 or TTY: (231) 140-4241 TTY 636-586-8594) to talk to a trained counselor if you are experiencing a Mental Health or Behavioral Health Crisis or need someone to talk to.  [place], RN, BSN, CCM [facility], [facility] RN Care Manager Direct Dial: [phone]

## 2024-04-17 NOTE — Assessment & Plan Note (Signed)
 Currently doing okay on Wellbutrin .  Patient's weight loss has stabilized.  Continue medication as prescribed

## 2024-04-17 NOTE — Assessment & Plan Note (Signed)
 Patient's weight loss has abated and actually gaining weight.  Patient partially reports is clear fluid but even taking 4 to 5 pounds off for fluid patient is still up in weight.

## 2024-04-17 NOTE — Telephone Encounter (Signed)
 FMLA for Patient forms received for completion for patient. Patient has been informed that process may take up to 5 business days.  Employer Name [employer] Reason for being out: Cardiac  Any inpatient care:4.29.25  Patient is requesting start date of 4.29.25 Patient is requesting end date of 8.3.25  Continuous period of time  Verified with patient that it is ok to leave Voicemail updates on  Mobile [phone] (mobile)  Patient would like to pick up copy in our office when form is completed along with a copy of received fax by employer.  Fax number form should be sent to is [phone]  Forms placed in providers box for review.

## 2024-04-17 NOTE — Transitions of Care (Post Inpatient/ED Visit) (Signed)
 Transition of Care week 2  Visit Note  04/17/2024  Name: Parker Gomez MRN: [id number]          DOB: Oct 17, 1976  Situation: Patient enrolled in [facility] 30-day program. Visit completed with patient by telephone.   Background:   Initial Transition Care Management Follow-up Telephone Call Date of Discharge: 04/07/24 Discharge Facility: Other ([facility]) Name of Other ([facility]) Discharge Facility: [facility] Type of Discharge: Inpatient Admission Primary Inpatient Discharge Diagnosis:: CABG x2 How have you been since you were released from the hospital?: Better Any questions or concerns?: Yes Patient Questions/Concerns:: Nerve pain reactivated  Past Medical History:  Diagnosis Date   Spinal stenosis     Assessment: Patient Reported Symptoms: Cognitive Cognitive Status: Alert and oriented to person, place, and time      Neurological Neurological Review of Symptoms: Other: [place] Neurological Symptoms/Conditions [RPT]: C4 - C5 Cervical surgery Neurological Conditions: Spinal cord injury Neurological Management Strategies: Activity, Adequate rest, Medication therapy Neurological Self-Management Outcome: 1 (very bad) Neurological Comment: Follow up with PCP and Cardiologist  HEENT HEENT Symptoms Reported: No symptoms reported      Cardiovascular Cardiovascular Symptoms Reported: Swelling in legs or feet, Lightheadness (Diurectics working) Does patient have uncontrolled Hypertension?: No Cardiovascular Management Strategies: Weight management, Diet modification, Activity Weight: 178 lb (80.7 kg)  Respiratory Respiratory Symptoms Reported: No symptoms reported Respiratory Conditions: Seasonal allergies Respiratory Self-Management Outcome: 4 (good)  Endocrine Patient reports the following symptoms related to hypoglycemia or hyperglycemia : No symptoms reported    Gastrointestinal Gastrointestinal Symptoms Reported: No symptoms reported      Genitourinary Genitourinary Symptoms  Reported: No symptoms reported    Integumentary Integumentary Symptoms Reported: Incision Skin Management Strategies: Medication therapy Skin Self-Management Outcome: 4 (good)  Musculoskeletal Musculoskelatal Symptoms Reviewed: Unsteady gait, Weakness Additional Musculoskeletal Details: Walking better without rollator Musculoskeletal Conditions: Unsteady gait Musculoskeletal Management Strategies: Diet modification, Medical device, Medication therapy, Weight management Musculoskeletal Self-Management Outcome: 4 (good) Musculoskeletal Comment: Awaiting clearance for cardiac rehab Falls in the past year?: No Was there an injury with Fall?: No    Psychosocial Psychosocial Symptoms Reported: No symptoms reported         Vitals:   04/09/24 1039  BP: 121/70    Medications Reviewed Today     Reviewed by Jamie Mccoy, RN (Registered Nurse) on 04/17/24 at 1027  Med List Status: <None>   Medication Order Taking? Sig Documenting Provider Last Dose Status Informant  Acetaminophen  325 MG CAPS [id number]  Take 650 mg by mouth every 6 (six) hours as needed (for Pain for up to 10 days). [provider]  Active   albuterol  (VENTOLIN  HFA) 108 (90 Base) MCG/ACT inhaler [id number]  Inhale 1-2 puffs into the lungs every 6 (six) hours as needed for shortness of breath or wheezing. [provider]  Active Self  amLODipine  (NORVASC ) 5 MG tablet [id number]  Take 2.5 mg by mouth daily. [provider]  Active Self  aspirin  EC 81 MG tablet [id number]  Take 81 mg by mouth daily. [provider]  Active Self  atorvastatin (LIPITOR) 80 MG tablet [id number]  Take 80 mg by mouth daily. [provider]  Active   buPROPion  (WELLBUTRIN  XL) 150 MG 24 hr tablet [id number]  Take 1 tablet (150 mg total) by mouth daily. Dorothe Gaster, NP  Active Self  cyclobenzaprine  (FLEXERIL ) 10 MG tablet [id number]  Take 1 tablet (10 mg total) by mouth 3 (three) times daily as needed for  muscle spasms. Dorothe Gaster, NP  Active   fluticasone (FLONASE) 50 MCG/ACT nasal spray [id number]  Place 2 sprays into both nostrils daily as needed for allergies. [provider]  Active Self  furosemide (LASIX) 20 MG tablet [id number]  Take 20 mg by mouth daily. [provider]  Active            Med Note Jeana Michaels Apr 08, 2024  3:12 PM)  Please take potassium with lasix. If you change/ stop taking lasix dose, change/stop taking potassium dose.    hydrOXYzine  (ATARAX ) 10 MG tablet [id number] No Take 1 tablet (10 mg total) by mouth 3 (three) times daily as needed.  Patient not taking: Reported on 04/17/2024   Dorothe Gaster, NP Not Taking Active            Med Note Miriam American, Aundra Lee Apr 17, 2024 10:25 AM) Available but not needed  metoprolol tartrate (LOPRESSOR) 25 MG tablet [id number]  Take 25 mg by mouth 2 (two) times daily. [provider]  Active   oxyCODONE  (OXY IR/ROXICODONE ) 5 MG immediate release tablet [id number]  Take 5 mg by mouth every 6 (six) hours as needed for severe pain (pain score 7-10) (for Pain for up to 7 days). [provider]  Active   pantoprazole  (PROTONIX ) 40 MG tablet [id number]  Take 1 tablet (40 mg total) by mouth daily. Dorothe Gaster, NP  Active   polyethylene glycol (MIRALAX / GLYCOLAX) 17 g packet [id number]  Take 17 g by mouth 2 (two) times daily. Mix in 4-8ounces of fluid prior to taking [provider]  Active   potassium chloride SA (KLOR-CON M) 20 MEQ tablet [id number]  Take 20 mEq by mouth 2 (two) times daily. [provider]  Active   sennosides-docusate sodium (SENOKOT-S) 8.6-50 MG tablet [id number]  Take 2 tablets by mouth 2 (two) times daily. [provider]  Active   Sennosides-Docusate Sodium 8.6-50 MG CAPS [id number]  Take 2 tablets by mouth 2 (two) times daily. [provider]  Active   tadalafil  (CIALIS ) 10 MG tablet [id number] No TAKE 1 TABLET (10 MG TOTAL) BY  MOUTH EVERY OTHER DAY AS NEEDED FOR ERECTILE DYSFUNCTION.  Patient not taking: Reported on 04/17/2024   Dorothe Gaster, NP Not Taking Active Self           Med Note Miriam American, Aundra Lee Apr 17, 2024 10:27 AM) Available and waiting cardiologist/surgeon            Goals Addressed             This Visit's Progress    [facility] Transitions of Care (TOC) Care Plan   On track    Problems:  Recent Hospitalization for treatment of CAD Reviewed CABG and sternal precautions and S/S of infection SDOH barrier: difficulty paying mortgage/rent - recommended social work assistance, decline but to discuss next week's appointment if social worker is needed Goal:  Over the next 30 days, the patient will not experience hospital readmission  Interventions:   CAD Interventions: Assessed understanding of CAD diagnosis Medications reviewed including medications utilized in CAD treatment plan Reviewed Importance of taking all medications as prescribed Reviewed Importance of attending all scheduled provider appointments Advised to report any changes in symptoms or exercise tolerance Advised patient to discuss pain in upper shoulder with provider  Patient Self Care Activities:  Attend all scheduled provider appointments Call pharmacy for medication refills 3-7 days in advance of running out of  medications Call provider office for new concerns or questions  Notify RN Care Manager of TOC call rescheduling needs Participate in Transition of Care Program/Attend TOC scheduled calls Take medications as prescribed   Not smoking since hospitalization, and not using nicotine patches currently, has some availble if needed though.  Plan:  An initial telephone outreach has been scheduled for: [phone] [place] 3:20 pm Next PCP appointment scheduled for: [phone] Telephone follow up appointment with care management team member scheduled for:  04/26/2024 at 10:00 am [place] 10:15         [place], RN, BSN, CCM [facility], [facility] RN Care Manager Direct Dial: [phone]

## 2024-04-17 NOTE — Progress Notes (Signed)
 Established Patient Office Visit  Subjective   Patient ID: Parker Gomez, male    DOB: 1976-01-03  Age: 48 y.o. MRN: [id number]  Chief Complaint  Patient presents with   Follow-up   FMLA    Pt complains of need of FMLA. States that he is getting a "run around" of who is supposed to fill out the forms. Pt FMLA dates are April 28th-August 3rd. Pt also complains of need for doctors note to send to [employer] regarding out of work dates.     HPI  GAD/MDD: Patient was last seen by me on 03/22/2024.  Patient tried sertraline  with adverse drug event of suicidal ideation.  Patient was hesitant to start fluoxetine  and never did.  He was maintained on Wellbutrin  150 mg daily.  Patient spouse was on the phone last office and concern for medications causing patient's weight loss.  He lost 4 pounds in the previous week.  We will continue Wellbutrin  and add on hydroxyzine  10 mg 3 times daily as needed  Weight loss: Patient was concerned for weight loss that he lost approximate 4 pounds in the previous week.  Patient was getting ready to undergo a cardiac catheterization and had deferred changing medication at that juncture he is here for follow-up. He has gained   States that he went into the cath on 03/27/2024 and then had a CABG on 03/29/2024. States he was seen on 04/09/2024 and then see CT on 04/18/2024 for a follow up   ICU on 04/01/2024 and d/c 04/07/2024 States when he bends down he gets lightheaded. He will try and do some small taks and feels short of breath. He is suppose to start cardiac rehab after he sees CT  Patient has been out of work since 03/19/2024 when he was seen in the ED. He will be out tentatively until 06/23/2024 due to CABG with cardiac rehab    Review of Systems  Constitutional:  Negative for chills and fever.  Respiratory:  Negative for shortness of breath.   Cardiovascular:  Positive for chest pain.  Neurological:  Positive for dizziness. Negative for headaches.       Objective:     BP 98/60   Pulse 69   Temp 97.9 F (36.6 C) (Oral)   Ht 6\' 1"  (1.854 m)   Wt 180 lb 12.8 oz (82 kg)   SpO2 98%   BMI 23.85 kg/m  BP Readings from Last 3 Encounters:  04/17/24 98/60  04/09/24 121/70  03/27/24 109/66   Wt Readings from Last 3 Encounters:  04/17/24 180 lb 12.8 oz (82 kg)  04/17/24 178 lb (80.7 kg)  03/27/24 173 lb (78.5 kg)   SpO2 Readings from Last 3 Encounters:  04/17/24 98%  03/27/24 97%  03/22/24 99%    Physical Exam Vitals and nursing note reviewed.  Constitutional:      Appearance: Normal appearance.  Cardiovascular:     Rate and Rhythm: Normal rate and regular rhythm.     Heart sounds: Normal heart sounds.  Pulmonary:     Effort: Pulmonary effort is normal.     Breath sounds: Normal breath sounds.  Skin:         Comments: Well-approximated incision with staples in place.  3 laparoscopic incisions inferiorly with stitches in place.  Neurological:     Mental Status: He is alert.      No results found for any visits on 04/17/24.    The 10-year ASCVD risk score (Arnett DK, et al., 2019) is:  4.8%* (Cholesterol units were assumed)    Assessment & Plan:   Problem List Items Addressed This Visit       Other   GAD (generalized anxiety disorder)   Currently doing okay on Wellbutrin .  Patient's weight loss has stabilized.  Continue medication as prescribed      Weight loss - Primary   Patient's weight loss has abated and actually gaining weight.  Patient partially reports is clear fluid but even taking 4 to 5 pounds off for fluid patient is still up in weight.      S/P CABG x 2   Patient was seen at [facility] for cardiac cath that resulted in a hospital stay for CABG x 2 he has followed up with cardiology he has an appointment tomorrow with cardiothoracic surgery.  I will fill out FMLA and short-term disability from 03/19/2024 through 06/23/2024.  He will follow-up with me in 6 weeks       Return in about 6 weeks (around  05/29/2024) for Weight/Mood/FMLA.    Margarie Shay, NP

## 2024-04-17 NOTE — Patient Instructions (Signed)
 Nice to see you today Follow up with me in 6 weeks We will reach out once we have completed your paperwork

## 2024-04-17 NOTE — Assessment & Plan Note (Signed)
 Patient was seen at [facility] for cardiac cath that resulted in a hospital stay for CABG x 2 he has followed up with cardiology he has an appointment tomorrow with cardiothoracic surgery.  I will fill out FMLA and short-term disability from 03/19/2024 through 06/23/2024.  He will follow-up with me in 6 weeks

## 2024-04-18 DIAGNOSIS — I10 Essential (primary) hypertension: Secondary | ICD-10-CM | POA: Diagnosis not present

## 2024-04-18 DIAGNOSIS — Z951 Presence of aortocoronary bypass graft: Secondary | ICD-10-CM | POA: Diagnosis not present

## 2024-04-18 DIAGNOSIS — J90 Pleural effusion, not elsewhere classified: Secondary | ICD-10-CM | POA: Diagnosis not present

## 2024-04-18 DIAGNOSIS — I2583 Coronary atherosclerosis due to lipid rich plaque: Secondary | ICD-10-CM | POA: Diagnosis not present

## 2024-04-18 DIAGNOSIS — J9811 Atelectasis: Secondary | ICD-10-CM | POA: Diagnosis not present

## 2024-04-18 DIAGNOSIS — Z4889 Encounter for other specified surgical aftercare: Secondary | ICD-10-CM | POA: Diagnosis not present

## 2024-04-18 NOTE — Telephone Encounter (Signed)
 Forms completed and returned to Reeda Canner, RT

## 2024-04-18 NOTE — Telephone Encounter (Signed)
 Completed paperwork received and faxed to [phone]. Copy sent to scan. Spoke with patient, he will be in today to pick up paperwork.

## 2024-04-19 ENCOUNTER — Telehealth (HOSPITAL_COMMUNITY): Payer: Self-pay

## 2024-04-19 NOTE — Telephone Encounter (Signed)
 Patient called back stating he was given a referral from [facility] for our [facility] location yesterday on 5/29. Informed patient we have not received the referral yet and explained we will need to check it for completion once we receive it and will begin to process it from there. Patient acknowledged understanding.

## 2024-04-23 ENCOUNTER — Telehealth: Payer: Self-pay | Admitting: Nurse Practitioner

## 2024-04-23 ENCOUNTER — Encounter (HOSPITAL_COMMUNITY): Payer: Self-pay

## 2024-04-23 ENCOUNTER — Encounter (HOSPITAL_COMMUNITY)
Admission: RE | Admit: 2024-04-23 | Discharge: 2024-04-23 | Disposition: A | Discharge status: Home / Self Care | Source: Ambulatory Visit | Attending: Cardiology | Admitting: Cardiology

## 2024-04-23 VITALS — BP 102/62 | HR 76 | Ht 73.0 in | Wt 184.7 lb

## 2024-04-23 DIAGNOSIS — Z48812 Encounter for surgical aftercare following surgery on the circulatory system: Secondary | ICD-10-CM | POA: Insufficient documentation

## 2024-04-23 DIAGNOSIS — Z951 Presence of aortocoronary bypass graft: Secondary | ICD-10-CM | POA: Diagnosis not present

## 2024-04-23 NOTE — Telephone Encounter (Signed)
 Copied from CRM [id number]. Topic: General - Other >> Apr 23, 2024  9:29 AM Deaijah H wrote: Reason for CRM: Jearlean Mince [employer] called in stating they have not received treaty provider statement and would like case number ([id number]) to be included on fax [phone]

## 2024-04-23 NOTE — Progress Notes (Signed)
 Cardiac Rehab Medication Review   Does the patient  feel that his/her medications are working for him/her?  yes  Has the patient been experiencing any side effects to the medications prescribed?  no  Does the patient measure his/her own blood pressure or blood glucose at home?  Was checking it but not the past few days. Recommended to check AM/PM.  Does the patient have any problems obtaining medications due to transportation or finances?   no  Understanding of regimen: excellent Understanding of indications: excellent Potential of compliance: excellent    Comments: Updated Parker Gomez's medication list in EPIC    Parker Gomez 04/23/2024 1:55 PM

## 2024-04-23 NOTE — Progress Notes (Signed)
 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  Patient's Home Medications on Admission:  Current Outpatient Medications:    acetaminophen  (TYLENOL ) 500 MG tablet, Take 1,000 mg by mouth every 6 (six) hours as needed (every 6 hours as needed)., Disp: , Rfl:    albuterol  (VENTOLIN  HFA) 108 (90 Base) MCG/ACT inhaler, Inhale 1-2 puffs into the lungs every 6 (six) hours as needed for shortness of breath or wheezing., Disp: , Rfl:    aspirin  EC 81 MG tablet, Take 81 mg by mouth daily., Disp: , Rfl:    atorvastatin (LIPITOR) 80 MG tablet, Take 80 mg by mouth daily., Disp: , Rfl:    carvedilol (COREG) 3.125 MG tablet, Take 3.125 mg by mouth 2 (two) times daily with a meal., Disp: , Rfl:    cyclobenzaprine  (FLEXERIL ) 10 MG tablet, Take 1 tablet (10 mg total) by mouth 3 (three) times daily as needed for muscle spasms., Disp: 30 tablet, Rfl: 0   fluticasone (FLONASE) 50 MCG/ACT nasal spray, Place 2 sprays into both nostrils daily as needed for allergies., Disp: , Rfl:    hydrOXYzine  (ATARAX ) 10 MG tablet, Take 1 tablet (10 mg total) by mouth 3 (three) times daily as needed., Disp: 30 tablet, Rfl: 0   ipratropium (ATROVENT) 0.06 % nasal spray, Place 2 sprays into both nostrils 4 (four) times daily., Disp: , Rfl:    oxyCODONE  (OXY IR/ROXICODONE ) 5 MG immediate release tablet, Take 5 mg by mouth every 6 (six) hours as needed for severe pain (pain score 7-10) (for Pain for up to 7  days)., Disp: , Rfl:    pantoprazole  (PROTONIX ) 40 MG tablet, Take 1 tablet (40 mg total) by mouth daily., Disp: 90 tablet, Rfl: 0   polyethylene glycol (MIRALAX / GLYCOLAX) 17 g packet, Take 17 g by mouth 2 (two) times daily. Mix in 4-8ounces of fluid prior to taking, Disp: , Rfl:    tadalafil  (CIALIS ) 10 MG tablet, TAKE 1 TABLET (10 MG TOTAL) BY MOUTH EVERY OTHER DAY AS NEEDED FOR ERECTILE DYSFUNCTION., Disp: 6 tablet, Rfl: 3   amLODipine  (NORVASC ) 5 MG tablet, Take 2.5 mg by mouth daily., Disp: , Rfl:    buPROPion  (WELLBUTRIN  XL) 150 MG 24 hr tablet, Take 1 tablet (150 mg total) by mouth daily., Disp: 90 tablet, Rfl: 1   furosemide (LASIX) 20 MG tablet, Take 20 mg by mouth daily., Disp: , Rfl:    metoprolol tartrate (LOPRESSOR) 25 MG tablet, Take 25 mg by mouth 2 (two) times daily., Disp: , Rfl:    nicotine (NICODERM CQ - DOSED IN MG/24 HR) 7 mg/24hr patch, Place 1 patch onto the skin., Disp: , Rfl:    potassium chloride SA (KLOR-CON M) 20 MEQ tablet, Take 20 mEq by mouth 2 (two) times daily., Disp: , Rfl:    sennosides-docusate sodium (SENOKOT-S) 8.6-50 MG tablet, Take 2 tablets by mouth 2 (two) times daily., Disp: , Rfl:    Sennosides-Docusate Sodium 8.6-50 MG CAPS, Take 2  tablets by mouth 2 (two) times daily., Disp: , Rfl:   Past Medical History: Past Medical History:  Diagnosis Date   Spinal stenosis     Tobacco Use: Social History   Tobacco Use  Smoking Status Former   Current packs/day: 0.00   Average packs/day: 1 pack/day for 30.0 years (30.0 ttl pk-yrs)   Types: Cigarettes   Start date: 07/12/1992   Quit date: 07/12/2022   Years since quitting: 1.7  Smokeless Tobacco Never    Labs: Review Flowsheet       Latest Ref Rng & Units 04/06/2023 11/09/2023  Labs for ITP Cardiac and Pulmonary Rehab  Cholestrol 0 - 200 - 183      LDL (calc) - - 124      HDL-C 35 - 70 - 43      Trlycerides 40 - 160 - 85      Hemoglobin A1c - 5.8  5.6        Details       This result is  from an external source.         Capillary Blood Glucose: Lab Results  Component Value Date   GLUCAP 88 01/05/2021     Exercise Target Goals: Exercise Program Goal: Individual exercise prescription set using results from initial 6 min walk test and THRR while considering  patient's activity barriers and safety.   Exercise Prescription Goal: Initial exercise prescription builds to 30-45 minutes a day of aerobic activity, 2-3 days per week.  Home exercise guidelines will be given to patient during program as part of exercise prescription that the participant will acknowledge.  Activity Barriers & Risk Stratification:  Activity Barriers & Cardiac Risk Stratification - 04/23/24 1314       Activity Barriers & Cardiac Risk Stratification   Activity Barriers Arthritis;Back Problems;Neck/Spine Problems;Incisional Pain;Assistive Device;Other (comment)    Comments Back, neck pain, hip pain, bilateral arm/ shoulder pain. No lifting, pushing, pulling until 05/13/24.    Cardiac Risk Stratification High             6 Minute Walk:  6 Minute Walk     Row Name 04/23/24 1433         6 Minute Walk   Phase Initial     Distance 1348 feet     Walk Time 6 minutes     # of Rest Breaks 0     MPH 2.55     METS 4.29     RPE 11     Perceived Dyspnea  1     VO2 Peak 15.02     Symptoms Yes (comment)     Comments Mild shortness of breath. Right hip pain-3/10, left shoulder pain-8/10.     Resting HR 76 bpm     Resting BP 102/62     Resting Oxygen Saturation  99 %     Exercise Oxygen Saturation  during 6 min walk 99 %     Max Ex. HR 86 bpm     Max Ex. BP 112/60     2 Minute Post BP 112/60              Oxygen Initial Assessment:   Oxygen Re-Evaluation:   Oxygen Discharge (Final Oxygen Re-Evaluation):   Initial Exercise Prescription:  Initial Exercise Prescription - 04/23/24 1500       Date of Initial Exercise RX and Referring Provider   Date 04/23/24    Referring  Provider Alluri, Leo Rainbow, MD   Jacqueline Matsu, MD (  covering)   Expected Discharge Date 06/21/24      Recumbant Bike   Level 1    Minutes 15    METs 2.5      NuStep   Level 1    SPM 85    Minutes 15    METs 2.5      Prescription Details   Frequency (times per week) 3    Duration Progress to 30 minutes of continuous aerobic without signs/symptoms of physical distress      Intensity   THRR 40-80% of Max Heartrate 69-138    Ratings of Perceived Exertion 11-13    Perceived Dyspnea 0-4      Progression   Progression Continue to progress workloads to maintain intensity without signs/symptoms of physical distress.      Resistance Training   Training Prescription No             Perform Capillary Blood Glucose checks as needed.  Exercise Prescription Changes:   Exercise Comments:   Exercise Goals and Review:   Exercise Goals     Row Name 04/23/24 1314             Exercise Goals   Increase Physical Activity Yes       Intervention Provide advice, education, support and counseling about physical activity/exercise needs.;Develop an individualized exercise prescription for aerobic and resistive training based on initial evaluation findings, risk stratification, comorbidities and participant's personal goals.       Expected Outcomes Short Term: Attend rehab on a regular basis to increase amount of physical activity.;Long Term: Exercising regularly at least 3-5 days a week.;Long Term: Add in home exercise to make exercise part of routine and to increase amount of physical activity.       Increase Strength and Stamina Yes       Intervention Provide advice, education, support and counseling about physical activity/exercise needs.;Develop an individualized exercise prescription for aerobic and resistive training based on initial evaluation findings, risk stratification, comorbidities and participant's personal goals.       Expected Outcomes Short Term: Increase  workloads from initial exercise prescription for resistance, speed, and METs.;Short Term: Perform resistance training exercises routinely during rehab and add in resistance training at home;Long Term: Improve cardiorespiratory fitness, muscular endurance and strength as measured by increased METs and functional capacity ( )       Able to understand and use rate of perceived exertion (RPE) scale Yes       Intervention Provide education and explanation on how to use RPE scale       Expected Outcomes Short Term: Able to use RPE daily in rehab to express subjective intensity level;Long Term:  Able to use RPE to guide intensity level when exercising independently       Knowledge and understanding of Target Heart Rate Range (THRR) Yes       Intervention Provide education and explanation of THRR including how the numbers were predicted and where they are located for reference       Expected Outcomes Short Term: Able to state/look up THRR;Long Term: Able to use THRR to govern intensity when exercising independently;Short Term: Able to use daily as guideline for intensity in rehab       Able to check pulse independently Yes       Intervention Provide education and demonstration on how to check pulse in carotid and radial arteries.;Review the importance of being able to check your own pulse for safety during independent exercise  Expected Outcomes Short Term: Able to explain why pulse checking is important during independent exercise;Long Term: Able to check pulse independently and accurately       Understanding of Exercise Prescription Yes       Intervention Provide education, explanation, and written materials on patient's individual exercise prescription       Expected Outcomes Short Term: Able to explain program exercise prescription;Long Term: Able to explain home exercise prescription to exercise independently                Exercise Goals Re-Evaluation :   Discharge Exercise Prescription  (Final Exercise Prescription Changes):   Nutrition:  Target Goals: Understanding of nutrition guidelines, daily intake of sodium 1500mg , cholesterol 200mg , calories 30% from fat and 7% or less from saturated fats, daily to have 5 or more servings of fruits and vegetables.  Biometrics:  Pre Biometrics - 04/23/24 1310       Pre Biometrics   Waist Circumference 33.75 inches    Hip Circumference 39 inches    Waist to Hip Ratio 0.87 %    Triceps Skinfold 7.5 mm    % Body Fat 19.5 %    Grip Strength 34 kg    Flexibility --   Not performed, chronic back and neck pain   Single Leg Stand 30 seconds              Nutrition Therapy Plan and Nutrition Goals:   Nutrition Assessments:  MEDIFICTS Score Key: >=70 Need to make dietary changes  40-70 Heart Healthy Diet <= 40 Therapeutic Level Cholesterol Diet    Picture Your Plate Scores: <16 Unhealthy dietary pattern with much room for improvement. 41-50 Dietary pattern unlikely to meet recommendations for good health and room for improvement. 51-60 More healthful dietary pattern, with some room for improvement.  >60 Healthy dietary pattern, although there may be some specific behaviors that could be improved.    Nutrition Goals Re-Evaluation:   Nutrition Goals Re-Evaluation:   Nutrition Goals Discharge (Final Nutrition Goals Re-Evaluation):   Psychosocial: Target Goals: Acknowledge presence or absence of significant depression and/or stress, maximize coping skills, provide positive support system. Participant is able to verbalize types and ability to use techniques and skills needed for reducing stress and depression.  Initial Review & Psychosocial Screening:  Initial Psych Review & Screening - 04/23/24 1407       Initial Review   Current issues with History of Depression;Current Anxiety/Panic;Current Sleep Concerns;Current Stress Concerns    Source of Stress Concerns Chronic Illness      Family Dynamics   Good  Support System? Yes    Comments Parker Gomez has support from his wife and 2 children, his mother, siblings, mother-in-law, and friends. Parker Gomez is in counseling.      Barriers   Psychosocial barriers to participate in program The patient should benefit from training in stress management and relaxation.;Psychosocial barriers identified (see note)      Screening Interventions   Interventions Encouraged to exercise;To provide support and resources with identified psychosocial needs;Provide feedback about the scores to participant    Expected Outcomes Short Term goal: Utilizing psychosocial counselor, staff and physician to assist with identification of specific Stressors or current issues interfering with healing process. Setting desired goal for each stressor or current issue identified.;Long Term Goal: Stressors or current issues are controlled or eliminated.;Short Term goal: Identification and review with participant of any Quality of Life or Depression concerns found by scoring the questionnaire.;Long Term goal: The participant improves quality of  Life and PHQ9 Scores as seen by post scores and/or verbalization of changes             Quality of Life Scores:  Quality of Life - 04/23/24 1543       Quality of Life   Select Quality of Life      Quality of Life Scores   Health/Function Pre 16.8 %    Socioeconomic Pre 23.14 %    Psych/Spiritual Pre 21.43 %    Family Pre 22.8 %    GLOBAL Pre 19.94 %            Scores of 19 and below usually indicate a poorer quality of life in these areas.  A difference of  2-3 points is a clinically meaningful difference.  A difference of 2-3 points in the total score of the Quality of Life Index has been associated with significant improvement in overall quality of life, self-image, physical symptoms, and general health in studies assessing change in quality of life.  PHQ-9: Review Flowsheet  More data exists      04/23/2024 04/08/2024 03/22/2024  01/02/2024 11/16/2023  Depression screen PHQ 2/9  Decreased Interest 1 1 1  0 2  Down, Depressed, Hopeless 0 1 1 0 3  PHQ - 2 Score 1 2 2  0 5  Altered sleeping 3 0 0 1 3  Tired, decreased energy 3 2 2  0 2  Change in appetite 2 3 3 1 3   Feeling bad or failure about yourself  1 1 1  0 3  Trouble concentrating 0 0 0 0 2  Moving slowly or fidgety/restless 0 0 0 0 3  Suicidal thoughts 0 0 0 0 0  PHQ-9 Score 10 8 8 2 21   Difficult doing work/chores Somewhat difficult Not difficult at all Not difficult at all Not difficult at all Somewhat difficult   Interpretation of Total Score  Total Score Depression Severity:  1-4 = Minimal depression, 5-9 = Mild depression, 10-14 = Moderate depression, 15-19 = Moderately severe depression, 20-27 = Severe depression   Psychosocial Evaluation and Intervention:   Psychosocial Re-Evaluation:   Psychosocial Discharge (Final Psychosocial Re-Evaluation):   Vocational Rehabilitation: Provide vocational rehab assistance to qualifying candidates.   Vocational Rehab Evaluation & Intervention:  Vocational Rehab - 04/23/24 1341       Initial Vocational Rehab Evaluation & Intervention   Assessment shows need for Vocational Rehabilitation No      Vocational Rehab Re-Evaulation   Comments Parker Gomez plans to return to work at [employer] center.             Education: Education Goals: Education classes will be provided on a weekly basis, covering required topics. Participant will state understanding/return demonstration of topics presented.     Core Videos: Exercise    Move It!  Clinical staff conducted group or individual video education with verbal and written material and guidebook.  Patient learns the recommended Pritikin exercise program. Exercise with the goal of living a long, healthy life. Some of the health benefits of exercise include controlled diabetes, healthier blood pressure levels, improved cholesterol levels, improved heart  and lung capacity, improved sleep, and better body composition. Everyone should speak with their doctor before starting or changing an exercise routine.  Biomechanical Limitations Clinical staff conducted group or individual video education with verbal and written material and guidebook.  Patient learns how biomechanical limitations can impact exercise and how we can mitigate and possibly overcome limitations to have an impactful and balanced exercise routine.  Body Composition Clinical staff conducted group or individual video education with verbal and written material and guidebook.  Patient learns that body composition (ratio of muscle mass to fat mass) is a key component to assessing overall fitness, rather than body weight alone. Increased fat mass, especially visceral belly fat, can put us  at increased risk for metabolic syndrome, type 2 diabetes, heart disease, and even death. It is recommended to combine diet and exercise (cardiovascular and resistance training) to improve your body composition. Seek guidance from your physician and exercise physiologist before implementing an exercise routine.  Exercise Action Plan Clinical staff conducted group or individual video education with verbal and written material and guidebook.  Patient learns the recommended strategies to achieve and enjoy long-term exercise adherence, including variety, self-motivation, self-efficacy, and positive decision making. Benefits of exercise include fitness, good health, weight management, more energy, better sleep, less stress, and overall well-being.  Medical   Heart Disease Risk Reduction Clinical staff conducted group or individual video education with verbal and written material and guidebook.  Patient learns our heart is our most vital organ as it circulates oxygen, nutrients, white blood cells, and hormones throughout the entire body, and carries waste away. Data supports a plant-based eating plan like the  Pritikin Program for its effectiveness in slowing progression of and reversing heart disease. The video provides a number of recommendations to address heart disease.   Metabolic Syndrome and Belly Fat  Clinical staff conducted group or individual video education with verbal and written material and guidebook.  Patient learns what metabolic syndrome is, how it leads to heart disease, and how one can reverse it and keep it from coming back. You have metabolic syndrome if you have 3 of the following 5 criteria: abdominal obesity, high blood pressure, high triglycerides, low HDL cholesterol, and high blood sugar.  Hypertension and Heart Disease Clinical staff conducted group or individual video education with verbal and written material and guidebook.  Patient learns that high blood pressure, or hypertension, is very common in the [place] . Hypertension is largely due to excessive salt intake, but other important risk factors include being overweight, physical inactivity, drinking too much alcohol, smoking, and not eating enough potassium from fruits and vegetables. High blood pressure is a leading risk factor for heart attack, stroke, congestive heart failure, dementia, kidney failure, and premature death. Long-term effects of excessive salt intake include stiffening of the arteries and thickening of heart muscle and organ damage. Recommendations include ways to reduce hypertension and the risk of heart disease.  Diseases of Our Time - Focusing on Diabetes Clinical staff conducted group or individual video education with verbal and written material and guidebook.  Patient learns why the best way to stop diseases of our time is prevention, through food and other lifestyle changes. Medicine (such as prescription pills and surgeries) is often only a Band-Aid on the problem, not a long-term solution. Most common diseases of our time include obesity, type 2 diabetes, hypertension, heart disease, and  cancer. The Pritikin Program is recommended and has been proven to help reduce, reverse, and/or prevent the damaging effects of metabolic syndrome.  Nutrition   Overview of the Pritikin Eating Plan  Clinical staff conducted group or individual video education with verbal and written material and guidebook.  Patient learns about the Pritikin Eating Plan for disease risk reduction. The Pritikin Eating Plan emphasizes a wide variety of unrefined, minimally-processed carbohydrates, like fruits, vegetables, whole grains, and legumes. Go, Caution, and Stop food choices are  explained. Plant-based and lean animal proteins are emphasized. Rationale provided for low sodium intake for blood pressure control, low added sugars for blood sugar stabilization, and low added fats and oils for coronary artery disease risk reduction and weight management.  Calorie Density  Clinical staff conducted group or individual video education with verbal and written material and guidebook.  Patient learns about calorie density and how it impacts the Pritikin Eating Plan. Knowing the characteristics of the food you choose will help you decide whether those foods will lead to weight gain or weight loss, and whether you want to consume more or less of them. Weight loss is usually a side effect of the Pritikin Eating Plan because of its focus on low calorie-dense foods.  Label Reading  Clinical staff conducted group or individual video education with verbal and written material and guidebook.  Patient learns about the Pritikin recommended label reading guidelines and corresponding recommendations regarding calorie density, added sugars, sodium content, and whole grains.  Dining Out - Part 1  Clinical staff conducted group or individual video education with verbal and written material and guidebook.  Patient learns that restaurant meals can be sabotaging because they can be so high in calories, fat, sodium, and/or sugar. Patient  learns recommended strategies on how to positively address this and avoid unhealthy pitfalls.  Facts on Fats  Clinical staff conducted group or individual video education with verbal and written material and guidebook.  Patient learns that lifestyle modifications can be just as effective, if not more so, as many medications for lowering your risk of heart disease. A Pritikin lifestyle can help to reduce your risk of inflammation and atherosclerosis (cholesterol build-up, or plaque, in the artery walls). Lifestyle interventions such as dietary choices and physical activity address the cause of atherosclerosis. A review of the types of fats and their impact on blood cholesterol levels, along with dietary recommendations to reduce fat intake is also included.  Nutrition Action Plan  Clinical staff conducted group or individual video education with verbal and written material and guidebook.  Patient learns how to incorporate Pritikin recommendations into their lifestyle. Recommendations include planning and keeping personal health goals in mind as an important part of their success.  Healthy Mind-Set    Healthy Minds, Bodies, Hearts  Clinical staff conducted group or individual video education with verbal and written material and guidebook.  Patient learns how to identify when they are stressed. Video will discuss the impact of that stress, as well as the many benefits of stress management. Patient will also be introduced to stress management techniques. The way we think, act, and feel has an impact on our hearts.  How Our Thoughts Can Heal Our Hearts  Clinical staff conducted group or individual video education with verbal and written material and guidebook.  Patient learns that negative thoughts can cause depression and anxiety. This can result in negative lifestyle behavior and serious health problems. Cognitive behavioral therapy is an effective method to help control our thoughts in order to change  and improve our emotional outlook.  Additional Videos:  Exercise    Improving Performance  Clinical staff conducted group or individual video education with verbal and written material and guidebook.  Patient learns to use a non-linear approach by alternating intensity levels and lengths of time spent exercising to help burn more calories and lose more body fat. Cardiovascular exercise helps improve heart health, metabolism, hormonal balance, blood sugar control, and recovery from fatigue. Resistance training improves strength, endurance, balance, coordination,  reaction time, metabolism, and muscle mass. Flexibility exercise improves circulation, posture, and balance. Seek guidance from your physician and exercise physiologist before implementing an exercise routine and learn your capabilities and proper form for all exercise.  Introduction to Yoga  Clinical staff conducted group or individual video education with verbal and written material and guidebook.  Patient learns about yoga, a discipline of the coming together of mind, breath, and body. The benefits of yoga include improved flexibility, improved range of motion, better posture and core strength, increased lung function, weight loss, and positive self-image. Yoga's heart health benefits include lowered blood pressure, healthier heart rate, decreased cholesterol and triglyceride levels, improved immune function, and reduced stress. Seek guidance from your physician and exercise physiologist before implementing an exercise routine and learn your capabilities and proper form for all exercise.  Medical   Aging: Enhancing Your Quality of Life  Clinical staff conducted group or individual video education with verbal and written material and guidebook.  Patient learns key strategies and recommendations to stay in good physical health and enhance quality of life, such as prevention strategies, having an advocate, securing a Health Care Proxy and  Power of Attorney, and keeping a list of medications and system for tracking them. It also discusses how to avoid risk for bone loss.  Biology of Weight Control  Clinical staff conducted group or individual video education with verbal and written material and guidebook.  Patient learns that weight gain occurs because we consume more calories than we burn (eating more, moving less). Even if your body weight is normal, you may have higher ratios of fat compared to muscle mass. Too much body fat puts you at increased risk for cardiovascular disease, heart attack, stroke, type 2 diabetes, and obesity-related cancers. In addition to exercise, following the Pritikin Eating Plan can help reduce your risk.  Decoding Lab Results  Clinical staff conducted group or individual video education with verbal and written material and guidebook.  Patient learns that lab test reflects one measurement whose values change over time and are influenced by many factors, including medication, stress, sleep, exercise, food, hydration, pre-existing medical conditions, and more. It is recommended to use the knowledge from this video to become more involved with your lab results and evaluate your numbers to speak with your doctor.   Diseases of Our Time - Overview  Clinical staff conducted group or individual video education with verbal and written material and guidebook.  Patient learns that according to the CDC, 50% to 70% of chronic diseases (such as obesity, type 2 diabetes, elevated lipids, hypertension, and heart disease) are avoidable through lifestyle improvements including healthier food choices, listening to satiety cues, and increased physical activity.  Sleep Disorders Clinical staff conducted group or individual video education with verbal and written material and guidebook.  Patient learns how good quality and duration of sleep are important to overall health and well-being. Patient also learns about sleep  disorders and how they impact health along with recommendations to address them, including discussing with a physician.  Nutrition  Dining Out - Part 2 Clinical staff conducted group or individual video education with verbal and written material and guidebook.  Patient learns how to plan ahead and communicate in order to maximize their dining experience in a healthy and nutritious manner. Included are recommended food choices based on the type of restaurant the patient is visiting.   Fueling a Banker conducted group or individual video education with verbal and written material  and guidebook.  There is a strong connection between our food choices and our health. Diseases like obesity and type 2 diabetes are very prevalent and are in large-part due to lifestyle choices. The Pritikin Eating Plan provides plenty of food and hunger-curbing satisfaction. It is easy to follow, affordable, and helps reduce health risks.  Menu Workshop  Clinical staff conducted group or individual video education with verbal and written material and guidebook.  Patient learns that restaurant meals can sabotage health goals because they are often packed with calories, fat, sodium, and sugar. Recommendations include strategies to plan ahead and to communicate with the manager, chef, or server to help order a healthier meal.  Planning Your Eating Strategy  Clinical staff conducted group or individual video education with verbal and written material and guidebook.  Patient learns about the Pritikin Eating Plan and its benefit of reducing the risk of disease. The Pritikin Eating Plan does not focus on calories. Instead, it emphasizes high-quality, nutrient-rich foods. By knowing the characteristics of the foods, we choose, we can determine their calorie density and make informed decisions.  Targeting Your Nutrition Priorities  Clinical staff conducted group or individual video education with verbal and  written material and guidebook.  Patient learns that lifestyle habits have a tremendous impact on disease risk and progression. This video provides eating and physical activity recommendations based on your personal health goals, such as reducing LDL cholesterol, losing weight, preventing or controlling type 2 diabetes, and reducing high blood pressure.  Vitamins and Minerals  Clinical staff conducted group or individual video education with verbal and written material and guidebook.  Patient learns different ways to obtain key vitamins and minerals, including through a recommended healthy diet. It is important to discuss all supplements you take with your doctor.   Healthy Mind-Set    Smoking Cessation  Clinical staff conducted group or individual video education with verbal and written material and guidebook.  Patient learns that cigarette smoking and tobacco addiction pose a serious health risk which affects millions of people. Stopping smoking will significantly reduce the risk of heart disease, lung disease, and many forms of cancer. Recommended strategies for quitting are covered, including working with your doctor to develop a successful plan.  Culinary   Becoming a Set designer conducted group or individual video education with verbal and written material and guidebook.  Patient learns that cooking at home can be healthy, cost-effective, quick, and puts them in control. Keys to cooking healthy recipes will include looking at your recipe, assessing your equipment needs, planning ahead, making it simple, choosing cost-effective seasonal ingredients, and limiting the use of added fats, salts, and sugars.  Cooking - Breakfast and Snacks  Clinical staff conducted group or individual video education with verbal and written material and guidebook.  Patient learns how important breakfast is to satiety and nutrition through the entire day. Recommendations include key foods to eat  during breakfast to help stabilize blood sugar levels and to prevent overeating at meals later in the day. Planning ahead is also a key component.  Cooking - Educational psychologist conducted group or individual video education with verbal and written material and guidebook.  Patient learns eating strategies to improve overall health, including an approach to cook more at home. Recommendations include thinking of animal protein as a side on your plate rather than center stage and focusing instead on lower calorie dense options like vegetables, fruits, whole grains, and plant-based proteins, such as  beans. Making sauces in large quantities to freeze for later and leaving the skin on your vegetables are also recommended to maximize your experience.  Cooking - Healthy Salads and Dressing Clinical staff conducted group or individual video education with verbal and written material and guidebook.  Patient learns that vegetables, fruits, whole grains, and legumes are the foundations of the Pritikin Eating Plan. Recommendations include how to incorporate each of these in flavorful and healthy salads, and how to create homemade salad dressings. Proper handling of ingredients is also covered. Cooking - Soups and [employer] - Soups and Desserts Clinical staff conducted group or individual video education with verbal and written material and guidebook.  Patient learns that Pritikin soups and desserts make for easy, nutritious, and delicious snacks and meal components that are low in sodium, fat, sugar, and calorie density, while high in vitamins, minerals, and filling fiber. Recommendations include simple and healthy ideas for soups and desserts.   Overview     The Pritikin Solution Program Overview Clinical staff conducted group or individual video education with verbal and written material and guidebook.  Patient learns that the results of the Pritikin Program have been documented in more  than 100 articles published in peer-reviewed journals, and the benefits include reducing risk factors for (and, in some cases, even reversing) high cholesterol, high blood pressure, type 2 diabetes, obesity, and more! An overview of the three key pillars of the Pritikin Program will be covered: eating well, doing regular exercise, and having a healthy mind-set.  WORKSHOPS  Exercise: Exercise Basics: Building Your Action Plan Clinical staff led group instruction and group discussion with PowerPoint presentation and patient guidebook. To enhance the learning environment the use of posters, models and videos may be added. At the conclusion of this workshop, patients will comprehend the difference between physical activity and exercise, as well as the benefits of incorporating both, into their routine. Patients will understand the FITT (Frequency, Intensity, Time, and Type) principle and how to use it to build an exercise action plan. In addition, safety concerns and other considerations for exercise and cardiac rehab will be addressed by the presenter. The purpose of this lesson is to promote a comprehensive and effective weekly exercise routine in order to improve patients' overall level of fitness.   Managing Heart Disease: Your Path to a Healthier Heart Clinical staff led group instruction and group discussion with PowerPoint presentation and patient guidebook. To enhance the learning environment the use of posters, models and videos may be added.At the conclusion of this workshop, patients will understand the anatomy and physiology of the heart. Additionally, they will understand how Pritikin's three pillars impact the risk factors, the progression, and the management of heart disease.  The purpose of this lesson is to provide a high-level overview of the heart, heart disease, and how the Pritikin lifestyle positively impacts risk factors.  Exercise Biomechanics Clinical staff led group  instruction and group discussion with PowerPoint presentation and patient guidebook. To enhance the learning environment the use of posters, models and videos may be added. Patients will learn how the structural parts of their bodies function and how these functions impact their daily activities, movement, and exercise. Patients will learn how to promote a neutral spine, learn how to manage pain, and identify ways to improve their physical movement in order to promote healthy living. The purpose of this lesson is to expose patients to common physical limitations that impact physical activity. Participants will learn practical ways to adapt  and manage aches and pains, and to minimize their effect on regular exercise. Patients will learn how to maintain good posture while sitting, walking, and lifting.  Balance Training and Fall Prevention  Clinical staff led group instruction and group discussion with PowerPoint presentation and patient guidebook. To enhance the learning environment the use of posters, models and videos may be added. At the conclusion of this workshop, patients will understand the importance of their sensorimotor skills (vision, proprioception, and the vestibular system) in maintaining their ability to balance as they age. Patients will apply a variety of balancing exercises that are appropriate for their current level of function. Patients will understand the common causes for poor balance, possible solutions to these problems, and ways to modify their physical environment in order to minimize their fall risk. The purpose of this lesson is to teach patients about the importance of maintaining balance as they age and ways to minimize their risk of falling.  WORKSHOPS   Nutrition:  Fueling a Ship broker led group instruction and group discussion with PowerPoint presentation and patient guidebook. To enhance the learning environment the use of posters, models and  videos may be added. Patients will review the foundational principles of the Pritikin Eating Plan and understand what constitutes a serving size in each of the food groups. Patients will also learn Pritikin-friendly foods that are better choices when away from home and review make-ahead meal and snack options. Calorie density will be reviewed and applied to three nutrition priorities: weight maintenance, weight loss, and weight gain. The purpose of this lesson is to reinforce (in a group setting) the key concepts around what patients are recommended to eat and how to apply these guidelines when away from home by planning and selecting Pritikin-friendly options. Patients will understand how calorie density may be adjusted for different weight management goals.  Mindful Eating  Clinical staff led group instruction and group discussion with PowerPoint presentation and patient guidebook. To enhance the learning environment the use of posters, models and videos may be added. Patients will briefly review the concepts of the Pritikin Eating Plan and the importance of low-calorie dense foods. The concept of mindful eating will be introduced as well as the importance of paying attention to internal hunger signals. Triggers for non-hunger eating and techniques for dealing with triggers will be explored. The purpose of this lesson is to provide patients with the opportunity to review the basic principles of the Pritikin Eating Plan, discuss the value of eating mindfully and how to measure internal cues of hunger and fullness using the Hunger Scale. Patients will also discuss reasons for non-hunger eating and learn strategies to use for controlling emotional eating.  Targeting Your Nutrition Priorities Clinical staff led group instruction and group discussion with PowerPoint presentation and patient guidebook. To enhance the learning environment the use of posters, models and videos may be added. Patients will learn how  to determine their genetic susceptibility to disease by reviewing their family history. Patients will gain insight into the importance of diet as part of an overall healthy lifestyle in mitigating the impact of genetics and other environmental insults. The purpose of this lesson is to provide patients with the opportunity to assess their personal nutrition priorities by looking at their family history, their own health history and current risk factors. Patients will also be able to discuss ways of prioritizing and modifying the Pritikin Eating Plan for their highest risk areas  Menu  Clinical staff led group instruction and  group discussion with PowerPoint presentation and patient guidebook. To enhance the learning environment the use of posters, models and videos may be added. Using menus brought in from [employer], or printed from [employer], patients will apply the Pritikin dining out guidelines that were presented in the [employer] video. Patients will also be able to practice these guidelines in a variety of provided scenarios. The purpose of this lesson is to provide patients with the opportunity to practice hands-on learning of the Pritikin Dining Out guidelines with actual menus and practice scenarios.  Label Reading Clinical staff led group instruction and group discussion with PowerPoint presentation and patient guidebook. To enhance the learning environment the use of posters, models and videos may be added. Patients will review and discuss the Pritikin label reading guidelines presented in Pritikin's Label Reading Educational series video. Using fool labels brought in from local grocery stores and markets, patients will apply the label reading guidelines and determine if the packaged food meet the Pritikin guidelines. The purpose of this lesson is to provide patients with the opportunity to review, discuss, and practice hands-on learning of the Pritikin Label Reading  guidelines with actual packaged food labels. Cooking School  Pritikin's [employer] are designed to teach patients ways to prepare quick, simple, and affordable recipes at home. The importance of nutrition's role in chronic disease risk reduction is reflected in its emphasis in the overall Pritikin program. By learning how to prepare essential core Pritikin Eating Plan recipes, patients will increase control over what they eat; be able to customize the flavor of foods without the use of added salt, sugar, or fat; and improve the quality of the food they consume. By learning a set of core recipes which are easily assembled, quickly prepared, and affordable, patients are more likely to prepare more healthy foods at home. These workshops focus on convenient breakfasts, simple entres, side dishes, and desserts which can be prepared with minimal effort and are consistent with nutrition recommendations for cardiovascular risk reduction. Cooking [employer] are taught by a Armed forces logistics/support/administrative officer (RD) who has been trained by the [employer]. The chef or RD has a clear understanding of the importance of minimizing - if not completely eliminating - added fat, sugar, and sodium in recipes. Throughout the series of Cooking School Workshop sessions, patients will learn about healthy ingredients and efficient methods of cooking to build confidence in their capability to prepare    Cooking School weekly topics:  Adding Flavor- Sodium-Free  Fast and Healthy Breakfasts  Powerhouse Plant-Based Proteins  Satisfying Salads and Dressings  Simple Sides and Sauces  International Cuisine-Spotlight on the [employer] Zones  Delicious Desserts  Savory Soups  [employer] - Meals in a Astronomer Appetizers and Snacks  Comforting Weekend Breakfasts  One-Pot Wonders   Fast Evening Meals  Landscape architect Your Pritikin Plate  WORKSHOPS   Healthy Mindset  (Psychosocial):  Focused Goals, Sustainable Changes Clinical staff led group instruction and group discussion with PowerPoint presentation and patient guidebook. To enhance the learning environment the use of posters, models and videos may be added. Patients will be able to apply effective goal setting strategies to establish at least one personal goal, and then take consistent, meaningful action toward that goal. They will learn to identify common barriers to achieving personal goals and develop strategies to overcome them. Patients will also gain an understanding of how our mind-set can impact our ability to achieve goals  and the importance of cultivating a positive and growth-oriented mind-set. The purpose of this lesson is to provide patients with a deeper understanding of how to set and achieve personal goals, as well as the tools and strategies needed to overcome common obstacles which may arise along the way.  From Head to Heart: The Power of a Healthy Outlook  Clinical staff led group instruction and group discussion with PowerPoint presentation and patient guidebook. To enhance the learning environment the use of posters, models and videos may be added. Patients will be able to recognize and describe the impact of emotions and mood on physical health. They will discover the importance of self-care and explore self-care practices which may work for them. Patients will also learn how to utilize the 4 C's to cultivate a healthier outlook and better manage stress and challenges. The purpose of this lesson is to demonstrate to patients how a healthy outlook is an essential part of maintaining good health, especially as they continue their cardiac rehab journey.  Healthy Sleep for a Healthy Heart Clinical staff led group instruction and group discussion with PowerPoint presentation and patient guidebook. To enhance the learning environment the use of posters, models and videos may be added. At the  conclusion of this workshop, patients will be able to demonstrate knowledge of the importance of sleep to overall health, well-being, and quality of life. They will understand the symptoms of, and treatments for, common sleep disorders. Patients will also be able to identify daytime and nighttime behaviors which impact sleep, and they will be able to apply these tools to help manage sleep-related challenges. The purpose of this lesson is to provide patients with a general overview of sleep and outline the importance of quality sleep. Patients will learn about a few of the most common sleep disorders. Patients will also be introduced to the concept of "sleep hygiene," and discover ways to self-manage certain sleeping problems through simple daily behavior changes. Finally, the workshop will motivate patients by clarifying the links between quality sleep and their goals of heart-healthy living.   Recognizing and Reducing Stress Clinical staff led group instruction and group discussion with PowerPoint presentation and patient guidebook. To enhance the learning environment the use of posters, models and videos may be added. At the conclusion of this workshop, patients will be able to understand the types of stress reactions, differentiate between acute and chronic stress, and recognize the impact that chronic stress has on their health. They will also be able to apply different coping mechanisms, such as reframing negative self-talk. Patients will have the opportunity to practice a variety of stress management techniques, such as deep abdominal breathing, progressive muscle relaxation, and/or guided imagery.  The purpose of this lesson is to educate patients on the role of stress in their lives and to provide healthy techniques for coping with it.  Learning Barriers/Preferences:  Learning Barriers/Preferences - 04/23/24 1340       Learning Barriers/Preferences   Learning Barriers None    Learning Preferences  Computer/Internet;Written Material;Video;Skilled Demonstration             Education Topics:  Knowledge Questionnaire Score:  Knowledge Questionnaire Score - 04/23/24 1543       Knowledge Questionnaire Score   Pre Score 25/28             Core Components/Risk Factors/Patient Goals at Admission:  Personal Goals and Risk Factors at Admission - 04/23/24 1341       Core Components/Risk Factors/Patient Goals on Admission  Weight Management Weight Maintenance;Yes    Intervention Weight Management: Develop a combined nutrition and exercise program designed to reach desired caloric intake, while maintaining appropriate intake of nutrient and fiber, sodium and fats, and appropriate energy expenditure required for the weight goal.;Weight Management: Provide education and appropriate resources to help participant work on and attain dietary goals.    Admit Weight 184 lb 11.9 oz (83.8 kg)    Expected Outcomes Short Term: Continue to assess and modify interventions until short term weight is achieved;Long Term: Adherence to nutrition and physical activity/exercise program aimed toward attainment of established weight goal;Weight Maintenance: Understanding of the daily nutrition guidelines, which includes 25-35% calories from fat, 7% or less cal from saturated fats, less than 200mg  cholesterol, less than 1.5gm of sodium, & 5 or more servings of fruits and vegetables daily    Tobacco Cessation Yes    Number of packs per day 1.25    Expected Outcomes Long Term: Complete abstinence from all tobacco products for at least 12 months from quit date.    Hypertension Yes    Intervention Provide education on lifestyle modifcations including regular physical activity/exercise, weight management, moderate sodium restriction and increased consumption of fresh fruit, vegetables, and low fat dairy, alcohol moderation, and smoking cessation.;Monitor prescription use compliance.    Expected Outcomes Short  Term: Continued assessment and intervention until BP is < 140/45mm HG in hypertensive participants. < 130/77mm HG in hypertensive participants with diabetes, heart failure or chronic kidney disease.;Long Term: Maintenance of blood pressure at goal levels.    Lipids Yes    Intervention Provide education and support for participant on nutrition & aerobic/resistive exercise along with prescribed medications to achieve LDL 70mg , HDL >40mg .    Expected Outcomes Short Term: Participant states understanding of desired cholesterol values and is compliant with medications prescribed. Participant is following exercise prescription and nutrition guidelines.;Long Term: Cholesterol controlled with medications as prescribed, with individualized exercise RX and with personalized nutrition plan. Value goals: LDL < 70mg , HDL > 40 mg.    Stress Yes    Intervention Offer individual and/or small group education and counseling on adjustment to heart disease, stress management and health-related lifestyle change. Teach and support self-help strategies.;Refer participants experiencing significant psychosocial distress to appropriate mental health specialists for further evaluation and treatment. When possible, include family members and significant others in education/counseling sessions.    Expected Outcomes Short Term: Participant demonstrates changes in health-related behavior, relaxation and other stress management skills, ability to obtain effective social support, and compliance with psychotropic medications if prescribed.;Long Term: Emotional wellbeing is indicated by absence of clinically significant psychosocial distress or social isolation.             Core Components/Risk Factors/Patient Goals Review:    Core Components/Risk Factors/Patient Goals at Discharge (Final Review):    ITP Comments:  ITP Comments     Row Name 04/23/24 1310           ITP Comments Medical Director- Dr. Gaylyn Keas, MD.  Introduction to the [facility] / Intensive Cardiac Rehab Program. Reviewed initial orientation folder with Parker Gomez.                Comments: Parker Gomez attended orientation for the cardiac rehabilitation program on  04/23/2024  to perform initial intake and exercise walk test. Patient introduced to the [facility] education and orientation packet was reviewed. Completed 6-minute walk test, measurements, initial ITP, and exercise prescription. Vital signs stable. Telemetry-normal sinus rhythm, mild shortness of breath during  walk test. Chronic hip, shoulder pain.   Service time was from 1310 to 1458.  Doree Games, MS, ACSM CEP 04/23/2024 [phone]

## 2024-04-24 ENCOUNTER — Telehealth: Payer: Self-pay

## 2024-04-24 DIAGNOSIS — F17200 Nicotine dependence, unspecified, uncomplicated: Secondary | ICD-10-CM | POA: Diagnosis not present

## 2024-04-24 DIAGNOSIS — Z1331 Encounter for screening for depression: Secondary | ICD-10-CM | POA: Diagnosis not present

## 2024-04-24 NOTE — Telephone Encounter (Signed)
 Received completed disability paperwork from provider. Faxed completed forms to [employer] [phone] Copy sent to scan Spoke with patient, he requests his copy be mailed to the address on file along with a copy of the received fax cover sheet.

## 2024-04-24 NOTE — Telephone Encounter (Signed)
 Faxed treaty statement with new case number and new fax number for [employer].

## 2024-04-26 ENCOUNTER — Other Ambulatory Visit: Payer: Self-pay

## 2024-04-26 NOTE — Transitions of Care (Post Inpatient/ED Visit) (Signed)
 Transition of Care week 3  Visit Note  04/26/2024  Name: Parker Gomez MRN: [id number]          DOB: [birth year]  Situation: Patient enrolled in [facility] 30-day program. Visit completed with patient by telephone.   Background:   Initial Transition Care Management Follow-up Telephone Call    Past Medical History:  Diagnosis Date   Spinal stenosis     Assessment: Patient Reported Symptoms: Cognitive Cognitive Status: Alert and oriented to person, place, and time      Neurological Neurological Review of Symptoms: No symptoms reported    HEENT HEENT Symptoms Reported: No symptoms reported      Cardiovascular Cardiovascular Symptoms Reported: Swelling in legs or feet Cardiovascular Conditions: Coronary artery disease Cardiovascular Management Strategies: Activity, Fluid modification Weight: 180 lb (81.6 kg) Cardiovascular Self-Management Outcome: 4 (good)  Respiratory Respiratory Symptoms Reported: No symptoms reported Respiratory Conditions: Seasonal allergies Respiratory Self-Management Outcome: 4 (good)  Endocrine Patient reports the following symptoms related to hypoglycemia or hyperglycemia : No symptoms reported    Gastrointestinal Gastrointestinal Symptoms Reported: No symptoms reported      Genitourinary Genitourinary Symptoms Reported: Frequency Additional Genitourinary Details: on diurectics    Integumentary Integumentary Symptoms Reported: Incision Additional Integumentary Details: no redness or swelling post op staples Skin Management Strategies: Medication therapy Skin Self-Management Outcome: 4 (good)  Musculoskeletal Musculoskelatal Symptoms Reviewed: Unsteady gait Musculoskeletal Management Strategies: Diet modification, Activity, Adequate rest Musculoskeletal Self-Management Outcome: 4 (good)      Psychosocial Psychosocial Symptoms Reported: No symptoms reported         There were no vitals filed for this visit.  Medications Reviewed Today      Reviewed by Jamie Mccoy, RN (Registered Nurse) on 04/26/24 at 1022  Med List Status: <None>   Medication Order Taking? Sig Documenting Provider Last Dose Status Informant  acetaminophen  (TYLENOL ) 500 MG tablet [id number]  Take 1,000 mg by mouth every 6 (six) hours as needed (every 6 hours as needed). [provider]  Active   albuterol  (VENTOLIN  HFA) 108 (90 Base) MCG/ACT inhaler [id number]  Inhale 1-2 puffs into the lungs every 6 (six) hours as needed for shortness of breath or wheezing. [provider]  Active Self  amLODipine  (NORVASC ) 5 MG tablet [id number]  Take 2.5 mg by mouth daily. [provider]  Active Self  aspirin  EC 81 MG tablet [id number]  Take 81 mg by mouth daily. [provider]  Active Self  atorvastatin (LIPITOR) 80 MG tablet [id number]  Take 80 mg by mouth daily. [provider]  Active   buPROPion  (WELLBUTRIN  XL) 150 MG 24 hr tablet [id number]  Take 1 tablet (150 mg total) by mouth daily. Dorothe Gaster, NP  Active Self  carvedilol (COREG) 3.125 MG tablet [id number]  Take 3.125 mg by mouth 2 (two) times daily with a meal. [provider]  Active   cyclobenzaprine  (FLEXERIL ) 10 MG tablet [id number]  Take 1 tablet (10 mg total) by mouth 3 (three) times daily as needed for muscle spasms. Dorothe Gaster, NP  Active   fluticasone [facility]) 50 MCG/ACT nasal spray [id number]  Place 2 sprays into both nostrils daily as needed for allergies. [provider]  Active Self  furosemide (LASIX) 20 MG tablet [id number] No Take 20 mg by mouth daily.  Patient not taking: Reported on 04/26/2024   [provider] Not Taking Active            Med Note Jeana Michaels Apr 08, 2024  3:12 PM)  Please take potassium with lasix. If you change/ stop taking lasix dose, change/stop taking potassium dose.    hydrOXYzine  (ATARAX ) 10 MG tablet [id number]  Take 1 tablet (10 mg total) by mouth 3 (three) times daily as needed. Dorothe Gaster, NP  Active            Med Note Miriam American, Aundra Lee Apr 17, 2024 10:25 AM) Available but not needed  ipratropium (ATROVENT) 0.06 % nasal spray [id number]  Place 2 sprays into both nostrils 4 (four) times daily. [provider]  Active   metoprolol tartrate (LOPRESSOR) 25 MG tablet [id number] No Take 25 mg by mouth 2 (two) times daily.  Patient not taking: Reported on 04/26/2024   [provider] Not Taking Active   nicotine (NICODERM CQ - DOSED IN MG/24 HR) 7 mg/24hr patch [id number]  Place 1 patch onto the skin. [provider]  Active   oxyCODONE  (OXY IR/ROXICODONE ) 5 MG immediate release tablet [id number]  Take 5 mg by mouth every 6 (six) hours as needed for severe pain (pain score 7-10) (for Pain for up to 7 days). [provider]  Active   pantoprazole  (PROTONIX ) 40 MG tablet [id number]  Take 1 tablet (40 mg total) by mouth daily. Dorothe Gaster, NP  Active   polyethylene glycol (MIRALAX / GLYCOLAX) 17 g packet [id number]  Take 17 g by mouth 2 (two) times daily. Mix in 4-8ounces of fluid prior to taking [provider]  Active   potassium chloride SA (KLOR-CON M) 20 MEQ tablet [id number] No Take 20 mEq by mouth 2 (two) times daily.  Patient not taking: Reported on 04/26/2024   [provider] Not Taking Active   sennosides-docusate sodium (SENOKOT-S) 8.6-50 MG tablet [id number]  Take 2 tablets by mouth 2 (two) times daily. [provider]  Active   Sennosides-Docusate Sodium 8.6-50 MG CAPS [id number]  Take 2 tablets by mouth 2 (two) times daily. [provider]  Active   tadalafil  (CIALIS ) 10 MG tablet [id number]  TAKE 1 TABLET (10 MG TOTAL) BY MOUTH EVERY OTHER DAY AS NEEDED FOR ERECTILE DYSFUNCTION. Dorothe Gaster, NP  Active Self           Med Note Miriam American, Aundra Lee Apr 17, 2024 10:27 AM) Available and waiting cardiologist/surgeon            Goals Addressed             This Visit's Progress    VBCI  Transitions of Care (TOC) Care Plan       Problems:  Recent Hospitalization for treatment of CAD Reviewed CABG and sternal precautions and S/S of infection SDOH barrier: difficulty paying mortgage/rent - recommended social work assistance, decline but to discuss next week's appointment if social worker is needed Goal:  Over the next 30 days, the patient will not experience hospital readmission  Interventions:   CAD Interventions: Assessed understanding of CAD diagnosis Medications reviewed including medications utilized in CAD treatment plan Reviewed Importance of taking all medications as prescribed Reviewed Importance of attending all scheduled provider appointments Advised to report any changes in symptoms or exercise tolerance Advised patient to discuss pain in upper shoulder with provider  Patient Self Care Activities:  Attend all scheduled provider appointments Call pharmacy for medication refills 3-7 days in advance of running out of medications Call provider office for new concerns or questions  Notify RN Care Manager of TOC  call rescheduling needs Participate in Transition of Care Program/Attend TOC scheduled calls Take medications as prescribed   Not smoking since hospitalization, and not using nicotine patches currently, has some availble if needed though. 1 month without smoking 9 days without nicotine patches   Plan:  Has a follow up with Cardiac rehab starting next week and wants to follow up on 6.17.2025. Patient states he will call this writer for any changes to schedule. Telephone follow up appointment with care management team member scheduled for:  05/07/24 at 10:45 am       Brown Cape, RN, BSN, CCM [place]  [facility], [facility] RN Care Manager Direct Dial: [phone]

## 2024-04-26 NOTE — Patient Instructions (Signed)
 Visit Information  Thank you for taking time to visit with me today. Please don't hesitate to contact me if I can be of assistance to you before our next scheduled telephone appointment.  Our next appointment is by telephone on May 08, 2023 at 10:45 am  Following is a copy of your care plan:   Goals Addressed             This Visit's Progress    VBCI Transitions of Care (TOC) Care Plan       Problems:  Recent Hospitalization for treatment of CAD Reviewed CABG and sternal precautions and S/S of infection SDOH barrier: difficulty paying mortgage/rent - recommended social work assistance, decline but to discuss next week's appointment if Child psychotherapist is needed Goal:  Over the next 30 days, the patient will not experience hospital readmission  Interventions:   CAD Interventions: Assessed understanding of CAD diagnosis Medications reviewed including medications utilized in CAD treatment plan Reviewed Importance of taking all medications as prescribed Reviewed Importance of attending all scheduled provider appointments Advised to report any changes in symptoms or exercise tolerance Advised patient to discuss pain in upper shoulder with provider  Patient Self Care Activities:  Attend all scheduled provider appointments Call pharmacy for medication refills 3-7 days in advance of running out of medications Call provider office for new concerns or questions  Notify RN Care Manager of TOC call rescheduling needs Participate in Transition of Care Program/Attend TOC scheduled calls Take medications as prescribed   Not smoking since hospitalization, and not using nicotine patches currently, has some availble if needed though. 1 month without smoking 9 days without nicotine patches   Plan:  Has a follow up Next PCP appointment scheduled for: [id number] Telephone follow up appointment with care management team member scheduled for:          Patient verbalizes understanding of  instructions and care plan provided today and agrees to view in MyChart. Active MyChart status and patient understanding of how to access instructions and care plan via MyChart confirmed with patient.     Telephone follow up appointment with care management team member scheduled for: The patient has been provided with contact information for the care management team and has been advised to call with any health related questions or concerns.  Follow up with provider re: May 29, 2024 Winthrop Hawks, NP  Please call the care guide team at [phone] if you need to cancel or reschedule your appointment.   Please call the [place]  National Suicide Prevention Lifeline: (516)071-7107 or TTY: 551-617-4679 TTY (930)105-9240) to talk to a trained counselor if you are experiencing a Mental Health or Behavioral Health Crisis or need someone to talk to. Brown Cape, RN, BSN, CCM [facility], [facility] Health RN Care Manager Direct Dial: [phone]

## 2024-04-29 ENCOUNTER — Encounter (HOSPITAL_COMMUNITY): Admission: RE | Admit: 2024-04-29 | Source: Ambulatory Visit

## 2024-04-29 ENCOUNTER — Telehealth (HOSPITAL_COMMUNITY): Payer: Self-pay | Admitting: *Deleted

## 2024-04-29 NOTE — Telephone Encounter (Signed)
 Parker Gomez called to report that he has a lot going on at home and overdid it this past weekend. This weeks appointments cancelled as requested. Will tentatively start ton 05/06/24.[name] RN BSN

## 2024-05-01 ENCOUNTER — Encounter (HOSPITAL_COMMUNITY)

## 2024-05-03 ENCOUNTER — Encounter (HOSPITAL_COMMUNITY)

## 2024-05-06 ENCOUNTER — Encounter (HOSPITAL_COMMUNITY)
Admission: RE | Admit: 2024-05-06 | Discharge: 2024-05-06 | Disposition: A | Discharge status: Home / Self Care | Source: Ambulatory Visit | Attending: Cardiology

## 2024-05-06 DIAGNOSIS — Z951 Presence of aortocoronary bypass graft: Secondary | ICD-10-CM | POA: Diagnosis not present

## 2024-05-06 DIAGNOSIS — Z48812 Encounter for surgical aftercare following surgery on the circulatory system: Secondary | ICD-10-CM | POA: Diagnosis not present

## 2024-05-06 NOTE — Progress Notes (Signed)
 Daily Session Note  Patient Details  Name: Parker Gomez MRN: [id number] Date of Birth: 1976/08/18 Referring Provider:   Flowsheet Row INTENSIVE CARDIAC REHAB ORIENT from 04/23/2024 in [facility]  Referring Provider Alluri, Leo Rainbow, MD  Jacqueline Matsu, MD (covering)]    Encounter Date: 05/06/2024  Check In:  Session Check In - 05/06/24 1128       Check-In   Supervising physician immediately available to respond to emergencies [facility] MD immediately available    Physician(s) Morey Ar, NP    Location [facility]    Staff Present Hilbert Loving, MS, ACSM-CEP, Exercise Physiologist;David Makemson, MS, ACSM-CEP, CCRP, Exercise Physiologist;Johnny Alexia Angelucci, MS, Exercise Physiologist;Jetta Otho Blitz BS, ACSM-CEP, Exercise Physiologist;Randi Regis Captain BS, ACSM-CEP, Exercise Physiologist;Maria Whitaker, RN, BSN    Virtual Visit No    Medication changes reported     No    Fall or balance concerns reported    No    Tobacco Cessation No Change    Warm-up and Cool-down Performed as group-led Writer Performed No    VAD Patient? No    PAD/SET Patient? No      Pain Assessment   Currently in Pain? No/denies    Pain Score 0-No pain    Multiple Pain Sites No          Capillary Blood Glucose: No results found for this or any previous visit (from the past 24 hours).   Exercise Prescription Changes - 05/06/24 1028       Response to Exercise   Blood Pressure (Admit) 112/70    Blood Pressure (Exercise) 114/74    Blood Pressure (Exit) 100/62    Heart Rate (Admit) 86 bpm    Heart Rate (Exercise) 100 bpm    Heart Rate (Exit) 89 bpm    Rating of Perceived Exertion (Exercise) 9    Symptoms None    Comments Off to a fair start with exercise. 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    Duration Continue with 30 min of aerobic exercise without signs/symptoms of physical  distress.    Intensity THRR unchanged      Progression   Progression Continue to progress workloads to maintain intensity without signs/symptoms of physical distress.    Average METs 2.7      Resistance Training   Training Prescription No      Interval Training   Interval Training No      Recumbant Bike   Level 1    RPM 58    Watts 41    Minutes 15    METs 3.3      NuStep   Level 1    SPM 70    Minutes 15    METs 2.1          Social History   Tobacco Use  Smoking Status Former   Current packs/day: 0.00   Average packs/day: 1 pack/day for 30.0 years (30.0 ttl pk-yrs)   Types: Cigarettes   Start date: 07/12/1992   Quit date: 07/12/2022   Years since quitting: 1.8  Smokeless Tobacco Never    Goals Met:  Exercise tolerated well  Goals Unmet:  Not Applicable  Comments: Nam tolerated low intensity exercise fair, c/o left leg pain at vein harvest site and some muscular pain around chest area. No hand weights or over head exercise performed. No arms used on recumbent stepper or bike. Exercise modified to accommodate  upper body restrictions through 05/13/24. Will continue to monitor and adjust exercise prescription as needed.   Dr. Gaylyn Keas is Medical Director for Cardiac Rehab at [facility].

## 2024-05-07 ENCOUNTER — Other Ambulatory Visit: Payer: Self-pay

## 2024-05-07 VITALS — BP 90/58 | Wt 182.0 lb

## 2024-05-07 DIAGNOSIS — Z558 Other problems related to education and literacy: Secondary | ICD-10-CM

## 2024-05-07 DIAGNOSIS — Z951 Presence of aortocoronary bypass graft: Secondary | ICD-10-CM

## 2024-05-07 NOTE — Progress Notes (Signed)
 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  Patient's Home Medications on Admission:  Current Outpatient Medications:    acetaminophen  (TYLENOL ) 500 MG tablet, Take 1,000 mg by mouth every 6 (six) hours as needed (every 6 hours as needed)., Disp: , Rfl:    albuterol  (VENTOLIN  HFA) 108 (90 Base) MCG/ACT inhaler, Inhale 1-2 puffs into the lungs every 6 (six) hours as needed for shortness of breath or wheezing., Disp: , Rfl:    amLODipine  (NORVASC ) 5 MG tablet, Take 2.5 mg by mouth daily., Disp: , Rfl:    aspirin  EC 81 MG tablet, Take 81 mg by mouth daily., Disp: , Rfl:    atorvastatin (LIPITOR) 80 MG tablet, Take 80 mg by mouth daily., Disp: , Rfl:    buPROPion  (WELLBUTRIN  XL) 150 MG 24 hr tablet, Take 1 tablet (150 mg total) by mouth daily., Disp: 90 tablet, Rfl: 1   carvedilol (COREG) 3.125 MG tablet, Take 3.125 mg by mouth 2 (two) times daily with a meal., Disp: , Rfl:    cyclobenzaprine  (FLEXERIL ) 10 MG tablet, Take 1 tablet (10 mg total) by mouth 3 (three) times daily as needed for muscle spasms., Disp: 30 tablet, Rfl: 0   fluticasone (FLONASE) 50 MCG/ACT nasal spray, Place 2 sprays into both nostrils daily as needed for allergies., Disp: , Rfl:    furosemide (LASIX) 20 MG tablet, Take 20 mg by mouth daily. (Patient not taking: Reported on 04/26/2024), Disp: , Rfl:    hydrOXYzine  (ATARAX ) 10 MG tablet, Take 1 tablet (10 mg total) by mouth 3 (three) times daily as needed.,  Disp: 30 tablet, Rfl: 0   ipratropium (ATROVENT) 0.06 % nasal spray, Place 2 sprays into both nostrils 4 (four) times daily., Disp: , Rfl:    metoprolol tartrate (LOPRESSOR) 25 MG tablet, Take 25 mg by mouth 2 (two) times daily. (Patient not taking: Reported on 04/26/2024), Disp: , Rfl:    nicotine (NICODERM CQ - DOSED IN MG/24 HR) 7 mg/24hr patch, Place 1 patch onto the skin., Disp: , Rfl:    oxyCODONE  (OXY IR/ROXICODONE ) 5 MG immediate release tablet, Take 5 mg by mouth every 6 (six) hours as needed for severe pain (pain score 7-10) (for Pain for up to 7 days)., Disp: , Rfl:    pantoprazole  (PROTONIX ) 40 MG tablet, Take 1 tablet (40 mg total) by mouth daily., Disp: 90 tablet, Rfl: 0   polyethylene glycol (MIRALAX / GLYCOLAX) 17 g packet, Take 17 g by mouth 2 (two) times daily. Mix in 4-8ounces of fluid prior to taking, Disp: , Rfl:    potassium chloride SA (KLOR-CON M) 20 MEQ tablet, Take 20 mEq by mouth 2 (two) times daily. (Patient not taking: Reported on 04/26/2024), Disp: , Rfl:    sennosides-docusate sodium (SENOKOT-S) 8.6-50 MG tablet, Take 2 tablets by mouth 2 (two) times daily., Disp: , Rfl:    Sennosides-Docusate Sodium 8.6-50 MG CAPS, Take 2 tablets by mouth 2 (two) times daily., Disp: , Rfl:    tadalafil  (CIALIS ) 10  MG tablet, TAKE 1 TABLET (10 MG TOTAL) BY MOUTH EVERY OTHER DAY AS NEEDED FOR ERECTILE DYSFUNCTION., Disp: 6 tablet, Rfl: 3  Past Medical History: Past Medical History:  Diagnosis Date   Spinal stenosis     Tobacco Use: Social History   Tobacco Use  Smoking Status Former   Current packs/day: 0.00   Average packs/day: 1 pack/day for 30.0 years (30.0 ttl pk-yrs)   Types: Cigarettes   Start date: 07/12/1992   Quit date: 07/12/2022   Years since quitting: 1.8  Smokeless Tobacco Never    Labs: Review Flowsheet       Latest Ref Rng & Units 04/06/2023 11/09/2023  Labs for ITP Cardiac and Pulmonary Rehab  Cholestrol 0 - 200 - 183      LDL (calc) - - 124      HDL-C 35  - 70 - 43      Trlycerides 40 - 160 - 85      Hemoglobin A1c - 5.8  5.6        Details       This result is from an external source.         Capillary Blood Glucose: Lab Results  Component Value Date   GLUCAP 88 01/05/2021     Exercise Target Goals: Exercise Program Goal: Individual exercise prescription set using results from initial 6 min walk test and THRR while considering  patient's activity barriers and safety.   Exercise Prescription Goal: Initial exercise prescription builds to 30-45 minutes a day of aerobic activity, 2-3 days per week.  Home exercise guidelines will be given to patient during program as part of exercise prescription that the participant will acknowledge.  Activity Barriers & Risk Stratification:  Activity Barriers & Cardiac Risk Stratification - 04/23/24 1314       Activity Barriers & Cardiac Risk Stratification   Activity Barriers Arthritis;Back Problems;Neck/Spine Problems;Incisional Pain;Assistive Device;Other (comment)    Comments Back, neck pain, hip pain, bilateral arm/ shoulder pain. No lifting, pushing, pulling until 05/13/24.    Cardiac Risk Stratification High          6 Minute Walk:  6 Minute Walk     Row Name 04/23/24 1433         6 Minute Walk   Phase Initial     Distance 1348 feet     Walk Time 6 minutes     # of Rest Breaks 0     MPH 2.55     METS 4.29     RPE 11     Perceived Dyspnea  1     VO2 Peak 15.02     Symptoms Yes (comment)     Comments Mild shortness of breath. Right hip pain-3/10, left shoulder pain-8/10.     Resting HR 76 bpm     Resting BP 102/62     Resting Oxygen Saturation  99 %     Exercise Oxygen Saturation  during 6 min walk 99 %     Max Ex. HR 86 bpm     Max Ex. BP 112/60     2 Minute Post BP 112/60        Oxygen Initial Assessment:   Oxygen Re-Evaluation:   Oxygen Discharge (Final Oxygen Re-Evaluation):   Initial Exercise Prescription:  Initial Exercise Prescription - 04/23/24  1500       Date of Initial Exercise RX and Referring Provider   Date 04/23/24    Referring Provider Alluri, Leo Rainbow, MD   Micael Adas,  Rufus Council, MD (covering)   Expected Discharge Date 06/21/24      Recumbant Bike   Level 1    Minutes 15    METs 2.5      NuStep   Level 1    SPM 85    Minutes 15    METs 2.5      Prescription Details   Frequency (times per week) 3    Duration Progress to 30 minutes of continuous aerobic without signs/symptoms of physical distress      Intensity   THRR 40-80% of Max Heartrate 69-138    Ratings of Perceived Exertion 11-13    Perceived Dyspnea 0-4      Progression   Progression Continue to progress workloads to maintain intensity without signs/symptoms of physical distress.      Resistance Training   Training Prescription No          Perform Capillary Blood Glucose checks as needed.  Exercise Prescription Changes:   Exercise Prescription Changes     Row Name 05/06/24 1028             Response to Exercise   Blood Pressure (Admit) 112/70       Blood Pressure (Exercise) 114/74       Blood Pressure (Exit) 100/62       Heart Rate (Admit) 86 bpm       Heart Rate (Exercise) 100 bpm       Heart Rate (Exit) 89 bpm       Rating of Perceived Exertion (Exercise) 9       Symptoms None       Comments Off to a fair start with exercise. 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       Duration Continue with 30 min of aerobic exercise without signs/symptoms of physical distress.       Intensity THRR unchanged         Progression   Progression Continue to progress workloads to maintain intensity without signs/symptoms of physical distress.       Average METs 2.7         Resistance Training   Training Prescription No         Interval Training   Interval Training No         Recumbant Bike   Level 1       RPM 58       Watts 41       Minutes 15       METs 3.3         NuStep   Level 1       SPM 70       Minutes 15        METs 2.1          Exercise Comments:   Exercise Comments     Row Name 05/06/24 1127           Exercise Comments Heitor tolerated low intensity exercise fair with some left leg pain. No weights or upper body activity through 05/13/24. Oriented him to the exercise equipment and stretching exercised with modifications for upper body restrictions.          Exercise Goals and Review:   Exercise Goals     Row Name 04/23/24 1314             Exercise Goals   Increase Physical Activity Yes       Intervention Provide advice, education, support and counseling about  physical activity/exercise needs.;Develop an individualized exercise prescription for aerobic and resistive training based on initial evaluation findings, risk stratification, comorbidities and participant's personal goals.       Expected Outcomes Short Term: Attend rehab on a regular basis to increase amount of physical activity.;Long Term: Exercising regularly at least 3-5 days a week.;Long Term: Add in home exercise to make exercise part of routine and to increase amount of physical activity.       Increase Strength and Stamina Yes       Intervention Provide advice, education, support and counseling about physical activity/exercise needs.;Develop an individualized exercise prescription for aerobic and resistive training based on initial evaluation findings, risk stratification, comorbidities and participant's personal goals.       Expected Outcomes Short Term: Increase workloads from initial exercise prescription for resistance, speed, and METs.;Short Term: Perform resistance training exercises routinely during rehab and add in resistance training at home;Long Term: Improve cardiorespiratory fitness, muscular endurance and strength as measured by increased METs and functional capacity ( )       Able to understand and use rate of perceived exertion (RPE) scale Yes       Intervention Provide education and explanation on how  to use RPE scale       Expected Outcomes Short Term: Able to use RPE daily in rehab to express subjective intensity level;Long Term:  Able to use RPE to guide intensity level when exercising independently       Knowledge and understanding of Target Heart Rate Range (THRR) Yes       Intervention Provide education and explanation of THRR including how the numbers were predicted and where they are located for reference       Expected Outcomes Short Term: Able to state/look up THRR;Long Term: Able to use THRR to govern intensity when exercising independently;Short Term: Able to use daily as guideline for intensity in rehab       Able to check pulse independently Yes       Intervention Provide education and demonstration on how to check pulse in carotid and radial arteries.;Review the importance of being able to check your own pulse for safety during independent exercise       Expected Outcomes Short Term: Able to explain why pulse checking is important during independent exercise;Long Term: Able to check pulse independently and accurately       Understanding of Exercise Prescription Yes       Intervention Provide education, explanation, and written materials on patient's individual exercise prescription       Expected Outcomes Short Term: Able to explain program exercise prescription;Long Term: Able to explain home exercise prescription to exercise independently          Exercise Goals Re-Evaluation :  Exercise Goals Re-Evaluation     Row Name 05/06/24 1127             Exercise Goal Re-Evaluation   Exercise Goals Review Increase Physical Activity;Increase Strength and Stamina;Able to understand and use rate of perceived exertion (RPE) scale       Comments Mohsen was able to understand and use RPE scale appropriately.       Expected Outcomes Progress workloads as tolerated to help increase strength and stamina.          Discharge Exercise Prescription (Final Exercise Prescription  Changes):  Exercise Prescription Changes - 05/06/24 1028       Response to Exercise   Blood Pressure (Admit) 112/70    Blood Pressure (Exercise) 114/74  Blood Pressure (Exit) 100/62    Heart Rate (Admit) 86 bpm    Heart Rate (Exercise) 100 bpm    Heart Rate (Exit) 89 bpm    Rating of Perceived Exertion (Exercise) 9    Symptoms None    Comments Off to a fair start with exercise. 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    Duration Continue with 30 min of aerobic exercise without signs/symptoms of physical distress.    Intensity THRR unchanged      Progression   Progression Continue to progress workloads to maintain intensity without signs/symptoms of physical distress.    Average METs 2.7      Resistance Training   Training Prescription No      Interval Training   Interval Training No      Recumbant Bike   Level 1    RPM 58    Watts 41    Minutes 15    METs 3.3      NuStep   Level 1    SPM 70    Minutes 15    METs 2.1          Nutrition:  Target Goals: Understanding of nutrition guidelines, daily intake of sodium 1500mg , cholesterol 200mg , calories 30% from fat and 7% or less from saturated fats, daily to have 5 or more servings of fruits and vegetables.  Biometrics:  Pre Biometrics - 04/23/24 1310       Pre Biometrics   Waist Circumference 33.75 inches    Hip Circumference 39 inches    Waist to Hip Ratio 0.87 %    Triceps Skinfold 7.5 mm    % Body Fat 19.5 %    Grip Strength 34 kg    Flexibility --   Not performed, chronic back and neck pain   Single Leg Stand 30 seconds           Nutrition Therapy Plan and Nutrition Goals:  Nutrition Therapy & Goals - 05/06/24 1144       Nutrition Therapy   Diet Heart Healthy Diet    Drug/Food Interactions Statins/Certain Fruits      Personal Nutrition Goals   Nutrition Goal Patient to identify strategies for reducing cardiovascular risk by attending the Pritikin education and nutrition series  weekly.    Personal Goal #2 Patient to improve diet quality by using the plate method as a guide for meal planning to include lean protein/plant protein, fruits, vegetables, whole grains, nonfat dairy as part of a well-balanced diet.    Comments Patient has medical history of CABGx2, toabacco abuse. LDL is not at goal. Patient will benefit from participation in intensive cardiac rehab and adherence to nutrition, exercise, and lifestyle modification.      Intervention Plan   Intervention Prescribe, educate and counsel regarding individualized specific dietary modifications aiming towards targeted core components such as weight, hypertension, lipid management, diabetes, heart failure and other comorbidities.;Nutrition handout(s) given to patient.    Expected Outcomes Short Term Goal: Understand basic principles of dietary content, such as calories, fat, sodium, cholesterol and nutrients.;Long Term Goal: Adherence to prescribed nutrition plan.          Nutrition Assessments:  MEDIFICTS Score Key: >=70 Need to make dietary changes  40-70 Heart Healthy Diet <= 40 Therapeutic Level Cholesterol Diet    Picture Your Plate Scores: <16 Unhealthy dietary pattern with much room for improvement. 41-50 Dietary pattern unlikely to meet recommendations for good health and room for improvement. 51-60 More healthful  dietary pattern, with some room for improvement.  >60 Healthy dietary pattern, although there may be some specific behaviors that could be improved.    Nutrition Goals Re-Evaluation:  Nutrition Goals Re-Evaluation     Row Name 05/06/24 1144             Goals   Current Weight 184 lb 11.9 oz (83.8 kg)       Comment A1c WNL, LDL 124, HDL 43       Expected Outcome Patient has medical history of CABGx2, toabacco abuse. LDL is not at goal. Patient will benefit from participation in intensive cardiac rehab and adherence to nutrition, exercise, and lifestyle modification.           Nutrition Goals Re-Evaluation:  Nutrition Goals Re-Evaluation     Row Name 05/06/24 1144             Goals   Current Weight 184 lb 11.9 oz (83.8 kg)       Comment A1c WNL, LDL 124, HDL 43       Expected Outcome Patient has medical history of CABGx2, toabacco abuse. LDL is not at goal. Patient will benefit from participation in intensive cardiac rehab and adherence to nutrition, exercise, and lifestyle modification.          Nutrition Goals Discharge (Final Nutrition Goals Re-Evaluation):  Nutrition Goals Re-Evaluation - 05/06/24 1144       Goals   Current Weight 184 lb 11.9 oz (83.8 kg)    Comment A1c WNL, LDL 124, HDL 43    Expected Outcome Patient has medical history of CABGx2, toabacco abuse. LDL is not at goal. Patient will benefit from participation in intensive cardiac rehab and adherence to nutrition, exercise, and lifestyle modification.          Psychosocial: Target Goals: Acknowledge presence or absence of significant depression and/or stress, maximize coping skills, provide positive support system. Participant is able to verbalize types and ability to use techniques and skills needed for reducing stress and depression.  Initial Review & Psychosocial Screening:  Initial Psych Review & Screening - 04/23/24 1407       Initial Review   Current issues with History of Depression;Current Anxiety/Panic;Current Sleep Concerns;Current Stress Concerns    Source of Stress Concerns Chronic Illness      Family Dynamics   Good Support System? Yes    Comments Jhamari has support from his wife and 2 children, his mother, siblings, mother-in-law, and friends. Amedee is in counseling.      Barriers   Psychosocial barriers to participate in program The patient should benefit from training in stress management and relaxation.;Psychosocial barriers identified (see note)      Screening Interventions   Interventions Encouraged to exercise;To provide support and resources  with identified psychosocial needs;Provide feedback about the scores to participant    Expected Outcomes Short Term goal: Utilizing psychosocial counselor, staff and physician to assist with identification of specific Stressors or current issues interfering with healing process. Setting desired goal for each stressor or current issue identified.;Long Term Goal: Stressors or current issues are controlled or eliminated.;Short Term goal: Identification and review with participant of any Quality of Life or Depression concerns found by scoring the questionnaire.;Long Term goal: The participant improves quality of Life and PHQ9 Scores as seen by post scores and/or verbalization of changes          Quality of Life Scores:  Quality of Life - 04/23/24 1543       Quality of  Life   Select Quality of Life      Quality of Life Scores   Health/Function Pre 16.8 %    Socioeconomic Pre 23.14 %    Psych/Spiritual Pre 21.43 %    Family Pre 22.8 %    GLOBAL Pre 19.94 %         Scores of 19 and below usually indicate a poorer quality of life in these areas.  A difference of  2-3 points is a clinically meaningful difference.  A difference of 2-3 points in the total score of the Quality of Life Index has been associated with significant improvement in overall quality of life, self-image, physical symptoms, and general health in studies assessing change in quality of life.  PHQ-9: Review Flowsheet  More data exists      04/23/2024 04/08/2024 03/22/2024 01/02/2024 11/16/2023  Depression screen PHQ 2/9  Decreased Interest 1 1 1  0 2  Down, Depressed, Hopeless 0 1 1 0 3  PHQ - 2 Score 1 2 2  0 5  Altered sleeping 3 0 0 1 3  Tired, decreased energy 3 2 2  0 2  Change in appetite 2 3 3 1 3   Feeling bad or failure about yourself  1 1 1  0 3  Trouble concentrating 0 0 0 0 2  Moving slowly or fidgety/restless 0 0 0 0 3  Suicidal thoughts 0 0 0 0 0  PHQ-9 Score 10 8 8 2 21   Difficult doing work/chores Somewhat  difficult Not difficult at all Not difficult at all Not difficult at all Somewhat difficult   Interpretation of Total Score  Total Score Depression Severity:  1-4 = Minimal depression, 5-9 = Mild depression, 10-14 = Moderate depression, 15-19 = Moderately severe depression, 20-27 = Severe depression   Psychosocial Evaluation and Intervention:   Psychosocial Re-Evaluation:  Psychosocial Re-Evaluation     Row Name 05/06/24 1747             Psychosocial Re-Evaluation   Current issues with History of Depression;Current Anxiety/Panic;Current Sleep Concerns;Current Stress Concerns       Comments Kwane started cardiac rehab on 05/06/24. Tyric did not have any increased concerns or stressors on his first day of exercise. Will discuss quality of life in the upcoming week       Expected Outcomes Savior will have controlled or decreased depression/ stressors upon completion of cardiac rehab       Interventions Stress management education;Encouraged to attend Cardiac Rehabilitation for the exercise;Relaxation education       Continue Psychosocial Services  Follow up required by staff         Initial Review   Source of Stress Concerns Family;Unable to participate in former interests or hobbies;Unable to perform yard/household activities       Comments Will continue to montior and offer support as needed          Psychosocial Discharge (Final Psychosocial Re-Evaluation):  Psychosocial Re-Evaluation - 05/06/24 1747       Psychosocial Re-Evaluation   Current issues with History of Depression;Current Anxiety/Panic;Current Sleep Concerns;Current Stress Concerns    Comments Tavio started cardiac rehab on 05/06/24. Jalal did not have any increased concerns or stressors on his first day of exercise. Will discuss quality of life in the upcoming week    Expected Outcomes Johannes will have controlled or decreased depression/ stressors upon completion of cardiac rehab    Interventions Stress  management education;Encouraged to attend Cardiac Rehabilitation for the exercise;Relaxation education    Continue Psychosocial  Services  Follow up required by staff      Initial Review   Source of Stress Concerns Family;Unable to participate in former interests or hobbies;Unable to perform yard/household activities    Comments Will continue to montior and offer support as needed          Vocational Rehabilitation: Provide vocational rehab assistance to qualifying candidates.   Vocational Rehab Evaluation & Intervention:  Vocational Rehab - 04/23/24 1341       Initial Vocational Rehab Evaluation & Intervention   Assessment shows need for Vocational Rehabilitation No      Vocational Rehab Re-Evaulation   Comments Treyce plans to return to work at [employer] center.          Education: Education Goals: Education classes will be provided on a weekly basis, covering required topics. Participant will state understanding/return demonstration of topics presented.    Education     Row Name 05/06/24 1300     Education   Cardiac Education Topics Pritikin   Geographical information systems officer Psychosocial   Psychosocial Workshop Focused Goals, Sustainable Changes   Instruction Review Code 1- Verbalizes Understanding   Class Start Time 1155   Class Stop Time 1235   Class Time Calculation (min) 40 min      Core Videos: Exercise    Move It!  Clinical staff conducted group or individual video education with verbal and written material and guidebook.  Patient learns the recommended Pritikin exercise program. Exercise with the goal of living a long, healthy life. Some of the health benefits of exercise include controlled diabetes, healthier blood pressure levels, improved cholesterol levels, improved heart and lung capacity, improved sleep, and better body composition. Everyone should speak with their doctor before starting or  changing an exercise routine.  Biomechanical Limitations Clinical staff conducted group or individual video education with verbal and written material and guidebook.  Patient learns how biomechanical limitations can impact exercise and how we can mitigate and possibly overcome limitations to have an impactful and balanced exercise routine.  Body Composition Clinical staff conducted group or individual video education with verbal and written material and guidebook.  Patient learns that body composition (ratio of muscle mass to fat mass) is a key component to assessing overall fitness, rather than body weight alone. Increased fat mass, especially visceral belly fat, can put us  at increased risk for metabolic syndrome, type 2 diabetes, heart disease, and even death. It is recommended to combine diet and exercise (cardiovascular and resistance training) to improve your body composition. Seek guidance from your physician and exercise physiologist before implementing an exercise routine.  Exercise Action Plan Clinical staff conducted group or individual video education with verbal and written material and guidebook.  Patient learns the recommended strategies to achieve and enjoy long-term exercise adherence, including variety, self-motivation, self-efficacy, and positive decision making. Benefits of exercise include fitness, good health, weight management, more energy, better sleep, less stress, and overall well-being.  Medical   Heart Disease Risk Reduction Clinical staff conducted group or individual video education with verbal and written material and guidebook.  Patient learns our heart is our most vital organ as it circulates oxygen, nutrients, white blood cells, and hormones throughout the entire body, and carries waste away. Data supports a plant-based eating plan like the Pritikin Program for its effectiveness in slowing progression of and reversing heart disease. The video provides a number of  recommendations to address heart disease.  Metabolic Syndrome and Belly Fat  Clinical staff conducted group or individual video education with verbal and written material and guidebook.  Patient learns what metabolic syndrome is, how it leads to heart disease, and how one can reverse it and keep it from coming back. You have metabolic syndrome if you have 3 of the following 5 criteria: abdominal obesity, high blood pressure, high triglycerides, low HDL cholesterol, and high blood sugar.  Hypertension and Heart Disease Clinical staff conducted group or individual video education with verbal and written material and guidebook.  Patient learns that high blood pressure, or hypertension, is very common in the [place] . Hypertension is largely due to excessive salt intake, but other important risk factors include being overweight, physical inactivity, drinking too much alcohol, smoking, and not eating enough potassium from fruits and vegetables. High blood pressure is a leading risk factor for heart attack, stroke, congestive heart failure, dementia, kidney failure, and premature death. Long-term effects of excessive salt intake include stiffening of the arteries and thickening of heart muscle and organ damage. Recommendations include ways to reduce hypertension and the risk of heart disease.  Diseases of Our Time - Focusing on Diabetes Clinical staff conducted group or individual video education with verbal and written material and guidebook.  Patient learns why the best way to stop diseases of our time is prevention, through food and other lifestyle changes. Medicine (such as prescription pills and surgeries) is often only a Band-Aid on the problem, not a long-term solution. Most common diseases of our time include obesity, type 2 diabetes, hypertension, heart disease, and cancer. The Pritikin Program is recommended and has been proven to help reduce, reverse, and/or prevent the damaging effects of  metabolic syndrome.  Nutrition   Overview of the Pritikin Eating Plan  Clinical staff conducted group or individual video education with verbal and written material and guidebook.  Patient learns about the Pritikin Eating Plan for disease risk reduction. The Pritikin Eating Plan emphasizes a wide variety of unrefined, minimally-processed carbohydrates, like fruits, vegetables, whole grains, and legumes. Go, Caution, and Stop food choices are explained. Plant-based and lean animal proteins are emphasized. Rationale provided for low sodium intake for blood pressure control, low added sugars for blood sugar stabilization, and low added fats and oils for coronary artery disease risk reduction and weight management.  Calorie Density  Clinical staff conducted group or individual video education with verbal and written material and guidebook.  Patient learns about calorie density and how it impacts the Pritikin Eating Plan. Knowing the characteristics of the food you choose will help you decide whether those foods will lead to weight gain or weight loss, and whether you want to consume more or less of them. Weight loss is usually a side effect of the Pritikin Eating Plan because of its focus on low calorie-dense foods.  Label Reading  Clinical staff conducted group or individual video education with verbal and written material and guidebook.  Patient learns about the Pritikin recommended label reading guidelines and corresponding recommendations regarding calorie density, added sugars, sodium content, and whole grains.  Dining Out - Part 1  Clinical staff conducted group or individual video education with verbal and written material and guidebook.  Patient learns that restaurant meals can be sabotaging because they can be so high in calories, fat, sodium, and/or sugar. Patient learns recommended strategies on how to positively address this and avoid unhealthy pitfalls.  Facts on Fats  Clinical staff  conducted group or individual video education with  verbal and written material and guidebook.  Patient learns that lifestyle modifications can be just as effective, if not more so, as many medications for lowering your risk of heart disease. A Pritikin lifestyle can help to reduce your risk of inflammation and atherosclerosis (cholesterol build-up, or plaque, in the artery walls). Lifestyle interventions such as dietary choices and physical activity address the cause of atherosclerosis. A review of the types of fats and their impact on blood cholesterol levels, along with dietary recommendations to reduce fat intake is also included.  Nutrition Action Plan  Clinical staff conducted group or individual video education with verbal and written material and guidebook.  Patient learns how to incorporate Pritikin recommendations into their lifestyle. Recommendations include planning and keeping personal health goals in mind as an important part of their success.  Healthy Mind-Set    Healthy Minds, Bodies, Hearts  Clinical staff conducted group or individual video education with verbal and written material and guidebook.  Patient learns how to identify when they are stressed. Video will discuss the impact of that stress, as well as the many benefits of stress management. Patient will also be introduced to stress management techniques. The way we think, act, and feel has an impact on our hearts.  How Our Thoughts Can Heal Our Hearts  Clinical staff conducted group or individual video education with verbal and written material and guidebook.  Patient learns that negative thoughts can cause depression and anxiety. This can result in negative lifestyle behavior and serious health problems. Cognitive behavioral therapy is an effective method to help control our thoughts in order to change and improve our emotional outlook.  Additional Videos:  Exercise    Improving Performance  Clinical staff conducted group  or individual video education with verbal and written material and guidebook.  Patient learns to use a non-linear approach by alternating intensity levels and lengths of time spent exercising to help burn more calories and lose more body fat. Cardiovascular exercise helps improve heart health, metabolism, hormonal balance, blood sugar control, and recovery from fatigue. Resistance training improves strength, endurance, balance, coordination, reaction time, metabolism, and muscle mass. Flexibility exercise improves circulation, posture, and balance. Seek guidance from your physician and exercise physiologist before implementing an exercise routine and learn your capabilities and proper form for all exercise.  Introduction to Yoga  Clinical staff conducted group or individual video education with verbal and written material and guidebook.  Patient learns about yoga, a discipline of the coming together of mind, breath, and body. The benefits of yoga include improved flexibility, improved range of motion, better posture and core strength, increased lung function, weight loss, and positive self-image. Yoga's heart health benefits include lowered blood pressure, healthier heart rate, decreased cholesterol and triglyceride levels, improved immune function, and reduced stress. Seek guidance from your physician and exercise physiologist before implementing an exercise routine and learn your capabilities and proper form for all exercise.  Medical   Aging: Enhancing Your Quality of Life  Clinical staff conducted group or individual video education with verbal and written material and guidebook.  Patient learns key strategies and recommendations to stay in good physical health and enhance quality of life, such as prevention strategies, having an advocate, securing a Health Care Proxy and Power of Attorney, and keeping a list of medications and system for tracking them. It also discusses how to avoid risk for bone  loss.  Biology of Weight Control  Clinical staff conducted group or individual video education with verbal and written  material and guidebook.  Patient learns that weight gain occurs because we consume more calories than we burn (eating more, moving less). Even if your body weight is normal, you may have higher ratios of fat compared to muscle mass. Too much body fat puts you at increased risk for cardiovascular disease, heart attack, stroke, type 2 diabetes, and obesity-related cancers. In addition to exercise, following the Pritikin Eating Plan can help reduce your risk.  Decoding Lab Results  Clinical staff conducted group or individual video education with verbal and written material and guidebook.  Patient learns that lab test reflects one measurement whose values change over time and are influenced by many factors, including medication, stress, sleep, exercise, food, hydration, pre-existing medical conditions, and more. It is recommended to use the knowledge from this video to become more involved with your lab results and evaluate your numbers to speak with your doctor.   Diseases of Our Time - Overview  Clinical staff conducted group or individual video education with verbal and written material and guidebook.  Patient learns that according to the CDC, 50% to 70% of chronic diseases (such as obesity, type 2 diabetes, elevated lipids, hypertension, and heart disease) are avoidable through lifestyle improvements including healthier food choices, listening to satiety cues, and increased physical activity.  Sleep Disorders Clinical staff conducted group or individual video education with verbal and written material and guidebook.  Patient learns how good quality and duration of sleep are important to overall health and well-being. Patient also learns about sleep disorders and how they impact health along with recommendations to address them, including discussing with a physician.  Nutrition   Dining Out - Part 2 Clinical staff conducted group or individual video education with verbal and written material and guidebook.  Patient learns how to plan ahead and communicate in order to maximize their dining experience in a healthy and nutritious manner. Included are recommended food choices based on the type of restaurant the patient is visiting.   Fueling a Banker conducted group or individual video education with verbal and written material and guidebook.  There is a strong connection between our food choices and our health. Diseases like obesity and type 2 diabetes are very prevalent and are in large-part due to lifestyle choices. The Pritikin Eating Plan provides plenty of food and hunger-curbing satisfaction. It is easy to follow, affordable, and helps reduce health risks.  Menu Workshop  Clinical staff conducted group or individual video education with verbal and written material and guidebook.  Patient learns that restaurant meals can sabotage health goals because they are often packed with calories, fat, sodium, and sugar. Recommendations include strategies to plan ahead and to communicate with the manager, chef, or server to help order a healthier meal.  Planning Your Eating Strategy  Clinical staff conducted group or individual video education with verbal and written material and guidebook.  Patient learns about the Pritikin Eating Plan and its benefit of reducing the risk of disease. The Pritikin Eating Plan does not focus on calories. Instead, it emphasizes high-quality, nutrient-rich foods. By knowing the characteristics of the foods, we choose, we can determine their calorie density and make informed decisions.  Targeting Your Nutrition Priorities  Clinical staff conducted group or individual video education with verbal and written material and guidebook.  Patient learns that lifestyle habits have a tremendous impact on disease risk and progression. This  video provides eating and physical activity recommendations based on your personal health goals, such as  reducing LDL cholesterol, losing weight, preventing or controlling type 2 diabetes, and reducing high blood pressure.  Vitamins and Minerals  Clinical staff conducted group or individual video education with verbal and written material and guidebook.  Patient learns different ways to obtain key vitamins and minerals, including through a recommended healthy diet. It is important to discuss all supplements you take with your doctor.   Healthy Mind-Set    Smoking Cessation  Clinical staff conducted group or individual video education with verbal and written material and guidebook.  Patient learns that cigarette smoking and tobacco addiction pose a serious health risk which affects millions of people. Stopping smoking will significantly reduce the risk of heart disease, lung disease, and many forms of cancer. Recommended strategies for quitting are covered, including working with your doctor to develop a successful plan.  Culinary   Becoming a Set designer conducted group or individual video education with verbal and written material and guidebook.  Patient learns that cooking at home can be healthy, cost-effective, quick, and puts them in control. Keys to cooking healthy recipes will include looking at your recipe, assessing your equipment needs, planning ahead, making it simple, choosing cost-effective seasonal ingredients, and limiting the use of added fats, salts, and sugars.  Cooking - Breakfast and Snacks  Clinical staff conducted group or individual video education with verbal and written material and guidebook.  Patient learns how important breakfast is to satiety and nutrition through the entire day. Recommendations include key foods to eat during breakfast to help stabilize blood sugar levels and to prevent overeating at meals later in the day. Planning ahead is also a  key component.  Cooking - Educational psychologist conducted group or individual video education with verbal and written material and guidebook.  Patient learns eating strategies to improve overall health, including an approach to cook more at home. Recommendations include thinking of animal protein as a side on your plate rather than center stage and focusing instead on lower calorie dense options like vegetables, fruits, whole grains, and plant-based proteins, such as beans. Making sauces in large quantities to freeze for later and leaving the skin on your vegetables are also recommended to maximize your experience.  Cooking - Healthy Salads and Dressing Clinical staff conducted group or individual video education with verbal and written material and guidebook.  Patient learns that vegetables, fruits, whole grains, and legumes are the foundations of the Pritikin Eating Plan. Recommendations include how to incorporate each of these in flavorful and healthy salads, and how to create homemade salad dressings. Proper handling of ingredients is also covered. Cooking - Soups and [employer] - Soups and Desserts Clinical staff conducted group or individual video education with verbal and written material and guidebook.  Patient learns that Pritikin soups and desserts make for easy, nutritious, and delicious snacks and meal components that are low in sodium, fat, sugar, and calorie density, while high in vitamins, minerals, and filling fiber. Recommendations include simple and healthy ideas for soups and desserts.   Overview     The Pritikin Solution Program Overview Clinical staff conducted group or individual video education with verbal and written material and guidebook.  Patient learns that the results of the Pritikin Program have been documented in more than 100 articles published in peer-reviewed journals, and the benefits include reducing risk factors for (and, in some cases, even  reversing) high cholesterol, high blood pressure, type 2 diabetes, obesity, and more! An overview of  the three key pillars of the Pritikin Program will be covered: eating well, doing regular exercise, and having a healthy mind-set.  WORKSHOPS  Exercise: Exercise Basics: Building Your Action Plan Clinical staff led group instruction and group discussion with PowerPoint presentation and patient guidebook. To enhance the learning environment the use of posters, models and videos may be added. At the conclusion of this workshop, patients will comprehend the difference between physical activity and exercise, as well as the benefits of incorporating both, into their routine. Patients will understand the FITT (Frequency, Intensity, Time, and Type) principle and how to use it to build an exercise action plan. In addition, safety concerns and other considerations for exercise and cardiac rehab will be addressed by the presenter. The purpose of this lesson is to promote a comprehensive and effective weekly exercise routine in order to improve patients' overall level of fitness.   Managing Heart Disease: Your Path to a Healthier Heart Clinical staff led group instruction and group discussion with PowerPoint presentation and patient guidebook. To enhance the learning environment the use of posters, models and videos may be added.At the conclusion of this workshop, patients will understand the anatomy and physiology of the heart. Additionally, they will understand how Pritikin's three pillars impact the risk factors, the progression, and the management of heart disease.  The purpose of this lesson is to provide a high-level overview of the heart, heart disease, and how the Pritikin lifestyle positively impacts risk factors.  Exercise Biomechanics Clinical staff led group instruction and group discussion with PowerPoint presentation and patient guidebook. To enhance the learning environment the use of  posters, models and videos may be added. Patients will learn how the structural parts of their bodies function and how these functions impact their daily activities, movement, and exercise. Patients will learn how to promote a neutral spine, learn how to manage pain, and identify ways to improve their physical movement in order to promote healthy living. The purpose of this lesson is to expose patients to common physical limitations that impact physical activity. Participants will learn practical ways to adapt and manage aches and pains, and to minimize their effect on regular exercise. Patients will learn how to maintain good posture while sitting, walking, and lifting.  Balance Training and Fall Prevention  Clinical staff led group instruction and group discussion with PowerPoint presentation and patient guidebook. To enhance the learning environment the use of posters, models and videos may be added. At the conclusion of this workshop, patients will understand the importance of their sensorimotor skills (vision, proprioception, and the vestibular system) in maintaining their ability to balance as they age. Patients will apply a variety of balancing exercises that are appropriate for their current level of function. Patients will understand the common causes for poor balance, possible solutions to these problems, and ways to modify their physical environment in order to minimize their fall risk. The purpose of this lesson is to teach patients about the importance of maintaining balance as they age and ways to minimize their risk of falling.  WORKSHOPS   Nutrition:  Fueling a Ship broker led group instruction and group discussion with PowerPoint presentation and patient guidebook. To enhance the learning environment the use of posters, models and videos may be added. Patients will review the foundational principles of the Pritikin Eating Plan and understand what constitutes a  serving size in each of the food groups. Patients will also learn Pritikin-friendly foods that are better choices when away from home  and review make-ahead meal and snack options. Calorie density will be reviewed and applied to three nutrition priorities: weight maintenance, weight loss, and weight gain. The purpose of this lesson is to reinforce (in a group setting) the key concepts around what patients are recommended to eat and how to apply these guidelines when away from home by planning and selecting Pritikin-friendly options. Patients will understand how calorie density may be adjusted for different weight management goals.  Mindful Eating  Clinical staff led group instruction and group discussion with PowerPoint presentation and patient guidebook. To enhance the learning environment the use of posters, models and videos may be added. Patients will briefly review the concepts of the Pritikin Eating Plan and the importance of low-calorie dense foods. The concept of mindful eating will be introduced as well as the importance of paying attention to internal hunger signals. Triggers for non-hunger eating and techniques for dealing with triggers will be explored. The purpose of this lesson is to provide patients with the opportunity to review the basic principles of the Pritikin Eating Plan, discuss the value of eating mindfully and how to measure internal cues of hunger and fullness using the Hunger Scale. Patients will also discuss reasons for non-hunger eating and learn strategies to use for controlling emotional eating.  Targeting Your Nutrition Priorities Clinical staff led group instruction and group discussion with PowerPoint presentation and patient guidebook. To enhance the learning environment the use of posters, models and videos may be added. Patients will learn how to determine their genetic susceptibility to disease by reviewing their family history. Patients will gain insight into the  importance of diet as part of an overall healthy lifestyle in mitigating the impact of genetics and other environmental insults. The purpose of this lesson is to provide patients with the opportunity to assess their personal nutrition priorities by looking at their family history, their own health history and current risk factors. Patients will also be able to discuss ways of prioritizing and modifying the Pritikin Eating Plan for their highest risk areas  Menu  Clinical staff led group instruction and group discussion with PowerPoint presentation and patient guidebook. To enhance the learning environment the use of posters, models and videos may be added. Using menus brought in from [employer], or printed from [employer], patients will apply the Pritikin dining out guidelines that were presented in the [employer] video. Patients will also be able to practice these guidelines in a variety of provided scenarios. The purpose of this lesson is to provide patients with the opportunity to practice hands-on learning of the Pritikin Dining Out guidelines with actual menus and practice scenarios.  Label Reading Clinical staff led group instruction and group discussion with PowerPoint presentation and patient guidebook. To enhance the learning environment the use of posters, models and videos may be added. Patients will review and discuss the Pritikin label reading guidelines presented in Pritikin's Label Reading Educational series video. Using fool labels brought in from local grocery stores and markets, patients will apply the label reading guidelines and determine if the packaged food meet the Pritikin guidelines. The purpose of this lesson is to provide patients with the opportunity to review, discuss, and practice hands-on learning of the Pritikin Label Reading guidelines with actual packaged food labels. Cooking School  Pritikin's [employer] are designed to teach  patients ways to prepare quick, simple, and affordable recipes at home. The importance of nutrition's role in chronic disease risk reduction is reflected in its  emphasis in the overall Pritikin program. By learning how to prepare essential core Pritikin Eating Plan recipes, patients will increase control over what they eat; be able to customize the flavor of foods without the use of added salt, sugar, or fat; and improve the quality of the food they consume. By learning a set of core recipes which are easily assembled, quickly prepared, and affordable, patients are more likely to prepare more healthy foods at home. These workshops focus on convenient breakfasts, simple entres, side dishes, and desserts which can be prepared with minimal effort and are consistent with nutrition recommendations for cardiovascular risk reduction. Cooking [employer] are taught by a Armed forces logistics/support/administrative officer (RD) who has been trained by the [employer]. The chef or RD has a clear understanding of the importance of minimizing - if not completely eliminating - added fat, sugar, and sodium in recipes. Throughout the series of Cooking School Workshop sessions, patients will learn about healthy ingredients and efficient methods of cooking to build confidence in their capability to prepare    Cooking School weekly topics:  Adding Flavor- Sodium-Free  Fast and Healthy Breakfasts  Powerhouse Plant-Based Proteins  Satisfying Salads and Dressings  Simple Sides and Sauces  International Cuisine-Spotlight on the [employer] Zones  Delicious Desserts  Savory Soups  [employer] - Meals in a Astronomer Appetizers and Snacks  Comforting Weekend Breakfasts  One-Pot Wonders   Fast Evening Meals  Landscape architect Your Pritikin Plate  WORKSHOPS   Healthy Mindset (Psychosocial):  Focused Goals, Sustainable Changes Clinical staff led group instruction and group discussion with PowerPoint  presentation and patient guidebook. To enhance the learning environment the use of posters, models and videos may be added. Patients will be able to apply effective goal setting strategies to establish at least one personal goal, and then take consistent, meaningful action toward that goal. They will learn to identify common barriers to achieving personal goals and develop strategies to overcome them. Patients will also gain an understanding of how our mind-set can impact our ability to achieve goals and the importance of cultivating a positive and growth-oriented mind-set. The purpose of this lesson is to provide patients with a deeper understanding of how to set and achieve personal goals, as well as the tools and strategies needed to overcome common obstacles which may arise along the way.  From Head to Heart: The Power of a Healthy Outlook  Clinical staff led group instruction and group discussion with PowerPoint presentation and patient guidebook. To enhance the learning environment the use of posters, models and videos may be added. Patients will be able to recognize and describe the impact of emotions and mood on physical health. They will discover the importance of self-care and explore self-care practices which may work for them. Patients will also learn how to utilize the 4 C's to cultivate a healthier outlook and better manage stress and challenges. The purpose of this lesson is to demonstrate to patients how a healthy outlook is an essential part of maintaining good health, especially as they continue their cardiac rehab journey.  Healthy Sleep for a Healthy Heart Clinical staff led group instruction and group discussion with PowerPoint presentation and patient guidebook. To enhance the learning environment the use of posters, models and videos may be added. At the conclusion of this workshop, patients will be able to demonstrate knowledge of the importance of sleep to overall health, well-being,  and quality of life. They will understand the  symptoms of, and treatments for, common sleep disorders. Patients will also be able to identify daytime and nighttime behaviors which impact sleep, and they will be able to apply these tools to help manage sleep-related challenges. The purpose of this lesson is to provide patients with a general overview of sleep and outline the importance of quality sleep. Patients will learn about a few of the most common sleep disorders. Patients will also be introduced to the concept of "sleep hygiene," and discover ways to self-manage certain sleeping problems through simple daily behavior changes. Finally, the workshop will motivate patients by clarifying the links between quality sleep and their goals of heart-healthy living.   Recognizing and Reducing Stress Clinical staff led group instruction and group discussion with PowerPoint presentation and patient guidebook. To enhance the learning environment the use of posters, models and videos may be added. At the conclusion of this workshop, patients will be able to understand the types of stress reactions, differentiate between acute and chronic stress, and recognize the impact that chronic stress has on their health. They will also be able to apply different coping mechanisms, such as reframing negative self-talk. Patients will have the opportunity to practice a variety of stress management techniques, such as deep abdominal breathing, progressive muscle relaxation, and/or guided imagery.  The purpose of this lesson is to educate patients on the role of stress in their lives and to provide healthy techniques for coping with it.  Learning Barriers/Preferences:  Learning Barriers/Preferences - 04/23/24 1340       Learning Barriers/Preferences   Learning Barriers None    Learning Preferences Computer/Internet;Written Material;Video;Skilled Demonstration          Education Topics:  Knowledge Questionnaire Score:   Knowledge Questionnaire Score - 04/23/24 1543       Knowledge Questionnaire Score   Pre Score 25/28          Core Components/Risk Factors/Patient Goals at Admission:  Personal Goals and Risk Factors at Admission - 04/23/24 1341       Core Components/Risk Factors/Patient Goals on Admission    Weight Management Weight Maintenance;Yes    Intervention Weight Management: Develop a combined nutrition and exercise program designed to reach desired caloric intake, while maintaining appropriate intake of nutrient and fiber, sodium and fats, and appropriate energy expenditure required for the weight goal.;Weight Management: Provide education and appropriate resources to help participant work on and attain dietary goals.    Admit Weight 184 lb 11.9 oz (83.8 kg)    Expected Outcomes Short Term: Continue to assess and modify interventions until short term weight is achieved;Long Term: Adherence to nutrition and physical activity/exercise program aimed toward attainment of established weight goal;Weight Maintenance: Understanding of the daily nutrition guidelines, which includes 25-35% calories from fat, 7% or less cal from saturated fats, less than 200mg  cholesterol, less than 1.5gm of sodium, & 5 or more servings of fruits and vegetables daily    Tobacco Cessation Yes    Number of packs per day 1.25    Expected Outcomes Long Term: Complete abstinence from all tobacco products for at least 12 months from quit date.    Hypertension Yes    Intervention Provide education on lifestyle modifcations including regular physical activity/exercise, weight management, moderate sodium restriction and increased consumption of fresh fruit, vegetables, and low fat dairy, alcohol moderation, and smoking cessation.;Monitor prescription use compliance.    Expected Outcomes Short Term: Continued assessment and intervention until BP is < 140/94mm HG in hypertensive participants. < 130/67mm HG in  hypertensive participants  with diabetes, heart failure or chronic kidney disease.;Long Term: Maintenance of blood pressure at goal levels.    Lipids Yes    Intervention Provide education and support for participant on nutrition & aerobic/resistive exercise along with prescribed medications to achieve LDL 70mg , HDL >40mg .    Expected Outcomes Short Term: Participant states understanding of desired cholesterol values and is compliant with medications prescribed. Participant is following exercise prescription and nutrition guidelines.;Long Term: Cholesterol controlled with medications as prescribed, with individualized exercise RX and with personalized nutrition plan. Value goals: LDL < 70mg , HDL > 40 mg.    Stress Yes    Intervention Offer individual and/or small group education and counseling on adjustment to heart disease, stress management and health-related lifestyle change. Teach and support self-help strategies.;Refer participants experiencing significant psychosocial distress to appropriate mental health specialists for further evaluation and treatment. When possible, include family members and significant others in education/counseling sessions.    Expected Outcomes Short Term: Participant demonstrates changes in health-related behavior, relaxation and other stress management skills, ability to obtain effective social support, and compliance with psychotropic medications if prescribed.;Long Term: Emotional wellbeing is indicated by absence of clinically significant psychosocial distress or social isolation.          Core Components/Risk Factors/Patient Goals Review:   Goals and Risk Factor Review     Row Name 05/06/24 1753             Core Components/Risk Factors/Patient Goals Review   Personal Goals Review Weight Management/Obesity;Hypertension;Lipids;Stress;Tobacco Cessation       Review Godwin started cardiac rehab on 05/06/24. Ladarious did well with exercise. Vital signs were stable. Lovel did report  having some sternal soreness. Advised patient to premedicate with tylenol   priot to coming to exercise,       Expected Outcomes Anthoney will continue to particpate in cardiac rehab for exercise, nutrition and lifestyle modifications          Core Components/Risk Factors/Patient Goals at Discharge (Final Review):   Goals and Risk Factor Review - 05/06/24 1753       Core Components/Risk Factors/Patient Goals Review   Personal Goals Review Weight Management/Obesity;Hypertension;Lipids;Stress;Tobacco Cessation    Review Andrzej started cardiac rehab on 05/06/24. Shiraz did well with exercise. Vital signs were stable. Zac did report having some sternal soreness. Advised patient to premedicate with tylenol   priot to coming to exercise,    Expected Outcomes Duvan will continue to particpate in cardiac rehab for exercise, nutrition and lifestyle modifications          ITP Comments:  ITP Comments     Row Name 04/23/24 1310 05/06/24 1127         ITP Comments Medical Director- Dr. Gaylyn Keas, MD. Introduction to the Pritikin Education / Intensive Cardiac Rehab Program. Reviewed initial orientation folder with Alessandro. 30 day ITP review. Eman started cardiac rehab today and tolerated low intensity exercise well. He has lifting restrictions through 05/13/24. Exercise modified accordingly, no weights used this week. No arm use on the recumbent stepper or bike.         Comments: See ITP Comments

## 2024-05-07 NOTE — Transitions of Care (Post Inpatient/ED Visit) (Signed)
 Transition of Care week 4  Visit Note  05/08/2024  Name: Parker Gomez MRN: [id number]          DOB: Oct 08, 1976  Situation: Patient enrolled in [facility] 30-day program. Visit completed with patient by telephone.   Background:   Initial Transition Care Management Follow-up Telephone Call    Past Medical History:  Diagnosis Date   Spinal stenosis     Assessment: Patient Reported Symptoms: Cognitive Cognitive Status: Alert and oriented to person, place, and time      Neurological Neurological Review of Symptoms: No symptoms reported Oher Neurological Symptoms/Conditions [RPT]: Chronic Neurological Conditions: Spinal cord injury  HEENT HEENT Symptoms Reported: No symptoms reported      Cardiovascular Cardiovascular Symptoms Reported: Swelling in legs or feet (less than before) Weight: 182 lb (82.6 kg)  Respiratory Respiratory Symptoms Reported: No symptoms reported Respiratory Self-Management Outcome: 4 (good) Respiratory Comment: SOB only with exertion in Cardiac Rehab  Endocrine Patient reports the following symptoms related to hypoglycemia or hyperglycemia : No symptoms reported Is patient diabetic?: No Endocrine Self-Management Outcome: 4 (good)  Gastrointestinal Gastrointestinal Symptoms Reported: No symptoms reported Additional Gastrointestinal Details: Encouraged to use medications as needed to avoid staining      Genitourinary Genitourinary Symptoms Reported: No symptoms reported Genitourinary Self-Management Outcome: 3 (uncertain) Genitourinary Comment: better  Integumentary Integumentary Symptoms Reported: Incision Additional Integumentary Details: no redness notes remains tender    Musculoskeletal Musculoskelatal Symptoms Reviewed: No symptoms reported Additional Musculoskeletal Details: Better        Psychosocial Psychosocial Symptoms Reported: No symptoms reported Additional Psychological Details: Denies - but on medications         Vitals:   05/07/24  1058  BP: (!) 90/58    Medications Reviewed Today     Reviewed by Jamie Mccoy, RN (Registered Nurse) on 05/07/24 at 1050  Med List Status: <None>   Medication Order Taking? Sig Documenting Provider Last Dose Status Informant  acetaminophen  (TYLENOL ) 500 MG tablet [id number]  Take 1,000 mg by mouth every 6 (six) hours as needed (every 6 hours as needed). [provider]  Active   albuterol  (VENTOLIN  HFA) 108 (90 Base) MCG/ACT inhaler [id number]  Inhale 1-2 puffs into the lungs every 6 (six) hours as needed for shortness of breath or wheezing. [provider]  Active Self  amLODipine  (NORVASC ) 5 MG tablet [id number]  Take 2.5 mg by mouth daily. [provider]  Active Self  aspirin  EC 81 MG tablet [id number]  Take 81 mg by mouth daily. [provider]  Active Self  atorvastatin (LIPITOR) 80 MG tablet [id number]  Take 80 mg by mouth daily. [provider]  Active   buPROPion  (WELLBUTRIN  XL) 150 MG 24 hr tablet [id number]  Take 1 tablet (150 mg total) by mouth daily. Dorothe Gaster, NP  Active Self  carvedilol (COREG) 3.125 MG tablet [id number]  Take 3.125 mg by mouth 2 (two) times daily with a meal. [provider]  Active   cyclobenzaprine  (FLEXERIL ) 10 MG tablet [id number]  Take 1 tablet (10 mg total) by mouth 3 (three) times daily as needed for muscle spasms. Dorothe Gaster, NP  Active   fluticasone [facility]) 50 MCG/ACT nasal spray [id number]  Place 2 sprays into both nostrils daily as needed for allergies. [provider]  Active Self  furosemide (LASIX) 20 MG tablet [id number]  Take 20 mg by mouth daily.  Patient not taking: Reported on 04/26/2024   [provider]  Active  Med Note Eilene Grater   Mon Apr 08, 2024  3:12 PM)  Please take potassium with lasix. If you change/ stop taking lasix dose, change/stop taking potassium dose.    hydrOXYzine  (ATARAX ) 10 MG tablet [id number]  Take 1 tablet (10 mg total)  by mouth 3 (three) times daily as needed. Dorothe Gaster, NP  Active            Med Note Miriam American, Aundra Lee Apr 17, 2024 10:25 AM) Available but not needed  ipratropium (ATROVENT) 0.06 % nasal spray [id number]  Place 2 sprays into both nostrils 4 (four) times daily. [provider]  Active   metoprolol tartrate (LOPRESSOR) 25 MG tablet [id number]  Take 25 mg by mouth 2 (two) times daily.  Patient not taking: Reported on 04/26/2024   [provider]  Active   nicotine (NICODERM CQ - DOSED IN MG/24 HR) 7 mg/24hr patch [id number]  Place 1 patch onto the skin. [provider]  Active   oxyCODONE  (OXY IR/ROXICODONE ) 5 MG immediate release tablet [id number]  Take 5 mg by mouth every 6 (six) hours as needed for severe pain (pain score 7-10) (for Pain for up to 7 days). [provider]  Active   pantoprazole  (PROTONIX ) 40 MG tablet [id number]  Take 1 tablet (40 mg total) by mouth daily. Dorothe Gaster, NP  Active   polyethylene glycol (MIRALAX / GLYCOLAX) 17 g packet [id number]  Take 17 g by mouth 2 (two) times daily. Mix in 4-8ounces of fluid prior to taking [provider]  Active   potassium chloride SA (KLOR-CON M) 20 MEQ tablet [id number]  Take 20 mEq by mouth 2 (two) times daily.  Patient not taking: Reported on 04/26/2024   [provider]  Active   sennosides-docusate sodium (SENOKOT-S) 8.6-50 MG tablet [id number]  Take 2 tablets by mouth 2 (two) times daily. [provider]  Active   Sennosides-Docusate Sodium 8.6-50 MG CAPS [id number]  Take 2 tablets by mouth 2 (two) times daily. [provider]  Active   tadalafil  (CIALIS ) 10 MG tablet [id number]  TAKE 1 TABLET (10 MG TOTAL) BY MOUTH EVERY OTHER DAY AS NEEDED FOR ERECTILE DYSFUNCTION. Dorothe Gaster, NP  Active Self           Med Note Miriam American, Aundra Lee Apr 17, 2024 10:27 AM) Available and waiting cardiologist/surgeon            Recommendation:   Referral to: CCM  team referral made    Brown [place], RN, BSN, CCM [place]  [facility], [facility] RN Care Manager Direct Dial: [phone]

## 2024-05-08 ENCOUNTER — Encounter (HOSPITAL_COMMUNITY)
Admission: RE | Admit: 2024-05-08 | Discharge: 2024-05-08 | Discharge status: Home / Self Care | Source: Ambulatory Visit | Attending: Cardiology

## 2024-05-08 DIAGNOSIS — Z951 Presence of aortocoronary bypass graft: Secondary | ICD-10-CM

## 2024-05-08 DIAGNOSIS — Z48812 Encounter for surgical aftercare following surgery on the circulatory system: Secondary | ICD-10-CM | POA: Diagnosis not present

## 2024-05-08 NOTE — Patient Instructions (Signed)
 Visit Information  Thank you for taking time to visit with me today. Please don't hesitate to contact me if I can be of assistance to you before our next scheduled telephone appointment.  Our next appointment is referred to [facility] team for complex care management needs with RN.  Following is a copy of your care plan:   Goals Addressed             This Visit's Progress    COMPLETED: [facility] Transitions of Care (TOC) Care Plan   On track    Problems:  [place]oad competed Recent Hospitalization for treatment of CAD Reviewed CABG and sternal precautions and S/S of infection SDOH barrier: difficulty paying mortgage/rent - recommended social work assistance, decline but to discuss next [facility] appointment if social worker is needed, 05/08/24 offered social worker follow up - declines follow up, states to return to work in August will follow up with PCP for paper work needed. Goal:  Over the next 30 days, the patient will not experience hospital readmission  Interventions:   CAD Interventions: Assessed understanding of CAD diagnosis Medications reviewed including medications utilized in CAD treatment plan Reviewed Importance of taking all medications as prescribed Reviewed Importance of attending all scheduled provider appointments Advised to report any changes in symptoms or exercise tolerance Advised patient to discuss pain in upper shoulder with provider Patient to continue to weigh daily, emphasized first thing in the morning and after voiding Patient to call [facility] today for refills needed  Patient Self Care Activities:  Attend all scheduled provider appointments Call pharmacy for medication refills 3-7 days in advance of running out of medications Call provider office for new concerns or questions  Notify RN Care Manager of TOC call rescheduling needs Participate in Transition of Care Program/Attend TOC scheduled calls Take medications as prescribed   Not smoking since hospitalization, and  not using nicotine patches currently, has some availble if needed though. 1 month without smoking 9 days without nicotine patches. GAP closures reviewed and discussed patient to follow up with PCP.  Plan:  Patient agrees to Complex Care Management follow up in about the next 30 days. Referral made to CCM Patient knows who to call for medical needs and 911 for emergencies Patient will follow up with Pharmacy at [facility] for refills of         Patient verbalizes understanding of instructions and care plan provided today and agrees to view in MyChart. Active MyChart status and patient understanding of how to access instructions and care plan via MyChart confirmed with patient.     The care management team will reach out to the patient again over the next 30  days.   Please call the care guide team at [phone] if you need to cancel or reschedule your appointment.   Please call the [place]  National Suicide Prevention Lifeline: 281 428 7102 or TTY: 330-202-1560 TTY [phone]) to talk to a trained counselor call 1-800-273-TALK (toll free, 24 hour hotline) if you are experiencing a Mental Health or Behavioral Health Crisis or need someone to talk to.  Brown Cape, RN, BSN, CCM [facility], [facility] RN Care Manager Direct Dial: [phone]

## 2024-05-10 ENCOUNTER — Encounter (HOSPITAL_COMMUNITY)
Admission: RE | Admit: 2024-05-10 | Discharge: 2024-05-10 | Disposition: A | Discharge status: Home / Self Care | Source: Ambulatory Visit | Attending: Cardiology | Admitting: Cardiology

## 2024-05-10 DIAGNOSIS — Z951 Presence of aortocoronary bypass graft: Secondary | ICD-10-CM

## 2024-05-10 DIAGNOSIS — Z48812 Encounter for surgical aftercare following surgery on the circulatory system: Secondary | ICD-10-CM | POA: Diagnosis not present

## 2024-05-10 NOTE — Progress Notes (Signed)
 QUALITY OF LIFE SCORE REVIEW  Pt completed Quality of Life survey as a participant in Cardiac Rehab.  Scores 21.0 or below are considered low.  Pt score very low in several areas Overall 19.96, Health and Function 16.8, socioeconomic 23.14, physiological and spiritual 21.43, family 22.8. Patient quality of life slightly altered by physical constraints which limits ability to perform as prior to recent cardiac illness. Parker Gomez denies being depressed. Parker Gomez says he has had some left shoulder soreness. Advised Parker Gomez to premedicate with Tylenol  prior to coming to exercise at cardiac rehab. Parker Gomez says he has been feeling better. Parker Gomez reports that he is sleeping better. Parker Gomez says that he plans to return to work in the beginning of August.  Offered emotional support and reassurance.  Will continue to monitor and intervene as necessary.[name] RN BSN

## 2024-05-13 ENCOUNTER — Encounter (HOSPITAL_COMMUNITY)
Admission: RE | Admit: 2024-05-13 | Discharge: 2024-05-13 | Disposition: A | Discharge status: Home / Self Care | Source: Ambulatory Visit | Attending: Cardiology

## 2024-05-13 DIAGNOSIS — Z951 Presence of aortocoronary bypass graft: Secondary | ICD-10-CM

## 2024-05-13 DIAGNOSIS — Z48812 Encounter for surgical aftercare following surgery on the circulatory system: Secondary | ICD-10-CM | POA: Diagnosis not present

## 2024-05-15 ENCOUNTER — Encounter (HOSPITAL_COMMUNITY)
Admission: RE | Admit: 2024-05-15 | Discharge: 2024-05-15 | Disposition: A | Discharge status: Home / Self Care | Source: Ambulatory Visit | Attending: Cardiology

## 2024-05-15 DIAGNOSIS — Z48812 Encounter for surgical aftercare following surgery on the circulatory system: Secondary | ICD-10-CM | POA: Diagnosis not present

## 2024-05-15 DIAGNOSIS — Z951 Presence of aortocoronary bypass graft: Secondary | ICD-10-CM | POA: Diagnosis not present

## 2024-05-17 ENCOUNTER — Encounter (HOSPITAL_COMMUNITY)
Admission: RE | Admit: 2024-05-17 | Discharge: 2024-05-17 | Disposition: A | Discharge status: Home / Self Care | Source: Ambulatory Visit | Attending: Cardiology | Admitting: Cardiology

## 2024-05-17 DIAGNOSIS — Z951 Presence of aortocoronary bypass graft: Secondary | ICD-10-CM

## 2024-05-17 DIAGNOSIS — Z48812 Encounter for surgical aftercare following surgery on the circulatory system: Secondary | ICD-10-CM | POA: Diagnosis not present

## 2024-05-20 ENCOUNTER — Telehealth (HOSPITAL_COMMUNITY): Payer: Self-pay | Admitting: *Deleted

## 2024-05-20 ENCOUNTER — Encounter (HOSPITAL_COMMUNITY)

## 2024-05-20 NOTE — Telephone Encounter (Signed)
 Parker Gomez left message on Sunday informing us  he will not be attending cardiac rehab on Monday due to a family emergency.

## 2024-05-21 ENCOUNTER — Ambulatory Visit: Admitting: Cardiovascular Disease

## 2024-05-21 ENCOUNTER — Telehealth: Payer: Self-pay

## 2024-05-21 NOTE — Progress Notes (Signed)
 Complex Care Management Note Care Guide Note  05/21/2024 Name: Parker Gomez MRN: [id number] DOB: [birth year]   Complex Care Management Outreach Attempts: An unsuccessful telephone outreach was attempted today to offer the patient information about available complex care management services.  Follow Up Plan:  Additional outreach attempts will be made to offer the patient complex care management information and services.   Encounter Outcome:  No Answer  Dreama [facility]  [facility] Care Management Assistant Direct Dial: [phone]  Fax: [phone]

## 2024-05-22 ENCOUNTER — Encounter (HOSPITAL_COMMUNITY)
Admission: RE | Admit: 2024-05-22 | Discharge: 2024-05-22 | Disposition: A | Discharge status: Home / Self Care | Source: Ambulatory Visit | Attending: Cardiology | Admitting: Cardiology

## 2024-05-22 DIAGNOSIS — Z951 Presence of aortocoronary bypass graft: Secondary | ICD-10-CM | POA: Insufficient documentation

## 2024-05-22 DIAGNOSIS — Z48812 Encounter for surgical aftercare following surgery on the circulatory system: Secondary | ICD-10-CM | POA: Diagnosis not present

## 2024-05-22 NOTE — Progress Notes (Signed)
 Reviewed home exercise guidelines with [name] including endpoints, temperature precautions, target heart rate and rate of perceived exertion. He plans to walk as his mode of home exercise. Discussed achieving 150 minutes of aerobic exercise each week. He can walk 30 minutes 2 or more days/week as tolerated. Parker Gomez voices understanding of instructions given.  Parker CHRISTELLA Gal, MS, ACSM CEP

## 2024-05-23 ENCOUNTER — Telehealth: Payer: Self-pay

## 2024-05-23 NOTE — Telephone Encounter (Signed)
 Copied from CRM [id number]. Topic: Clinical - Request for Lab/Test Order >> May 23, 2024  8:58 AM Donna BRAVO wrote: Reason for CRM: patient calling requesting labs, urine, chest x-ray please call patient when orders have been entered >> May 23, 2024  9:13 AM Mesmerise C wrote: Patient requested earlier appointment on 7/9 there was none available advised appt was added to wait list patient understood

## 2024-05-23 NOTE — Telephone Encounter (Signed)
 Not sure what he is requesting these labs and such for. We can discuss this at his appt.

## 2024-05-27 ENCOUNTER — Encounter (HOSPITAL_COMMUNITY)
Admission: RE | Admit: 2024-05-27 | Discharge: 2024-05-27 | Disposition: A | Discharge status: Home / Self Care | Source: Ambulatory Visit | Attending: Cardiology | Admitting: Cardiology

## 2024-05-27 DIAGNOSIS — Z951 Presence of aortocoronary bypass graft: Secondary | ICD-10-CM

## 2024-05-27 DIAGNOSIS — Z48812 Encounter for surgical aftercare following surgery on the circulatory system: Secondary | ICD-10-CM | POA: Diagnosis not present

## 2024-05-28 NOTE — Progress Notes (Signed)
 Complex Care Management Note  Care Guide Note 05/28/2024 Name: Parker Gomez MRN: [id number] DOB: [birth year]  [name] is a 48 y.o. year old male who sees Cable, Lynwood HERO, NP for primary care. I reached out to Bebe Pillow by phone today to offer complex care management services.  [name] was given information about Complex Care Management services today including:   The Complex Care Management services include support from the care team which includes your Nurse Care Manager, Clinical Social Worker, or Pharmacist.  The Complex Care Management team is here to help remove barriers to the health concerns and goals most important to you. Complex Care Management services are voluntary, and the patient may decline or stop services at any time by request to their care team member.   Complex Care Management Consent Status: Patient agreed to services and verbal consent obtained.   Follow up plan:  Telephone appointment with complex care management team member scheduled for:  06/13/24 at 1:30 p.m.   Encounter Outcome:  Patient Scheduled  [facility]  [facility], [facility] Care Management Assistant Direct Dial: [phone]  Fax: [phone]

## 2024-05-29 ENCOUNTER — Telehealth: Payer: Self-pay | Admitting: Nurse Practitioner

## 2024-05-29 ENCOUNTER — Encounter (HOSPITAL_COMMUNITY)
Admission: RE | Admit: 2024-05-29 | Discharge: 2024-05-29 | Disposition: A | Discharge status: Home / Self Care | Source: Ambulatory Visit | Attending: Cardiology

## 2024-05-29 ENCOUNTER — Ambulatory Visit (INDEPENDENT_AMBULATORY_CARE_PROVIDER_SITE_OTHER): Admitting: Nurse Practitioner

## 2024-05-29 VITALS — BP 92/60 | HR 67 | Temp 97.7°F | Ht 73.0 in | Wt 189.4 lb

## 2024-05-29 DIAGNOSIS — F411 Generalized anxiety disorder: Secondary | ICD-10-CM | POA: Diagnosis not present

## 2024-05-29 DIAGNOSIS — Z951 Presence of aortocoronary bypass graft: Secondary | ICD-10-CM

## 2024-05-29 DIAGNOSIS — R351 Nocturia: Secondary | ICD-10-CM

## 2024-05-29 DIAGNOSIS — F322 Major depressive disorder, single episode, severe without psychotic features: Secondary | ICD-10-CM

## 2024-05-29 DIAGNOSIS — Z48812 Encounter for surgical aftercare following surgery on the circulatory system: Secondary | ICD-10-CM | POA: Diagnosis not present

## 2024-05-29 DIAGNOSIS — R350 Frequency of micturition: Secondary | ICD-10-CM | POA: Diagnosis not present

## 2024-05-29 LAB — COMPREHENSIVE METABOLIC PANEL WITH GFR
ALT: 16 U/L (ref 0–53)
AST: 13 U/L (ref 0–37)
Albumin: 4.6 g/dL (ref 3.5–5.2)
Alkaline Phosphatase: 80 U/L (ref 39–117)
BUN: 17 mg/dL (ref 6–23)
CO2: 32 meq/L (ref 19–32)
Calcium: 9.9 mg/dL (ref 8.4–10.5)
Chloride: 101 meq/L (ref 96–112)
Creatinine, Ser: 0.93 mg/dL (ref 0.40–1.50)
GFR: 97.98 mL/min (ref >60.00)
Glucose, Bld: 98 mg/dL (ref 70–99)
Potassium: 4.6 meq/L (ref 3.5–5.1)
Sodium: 139 meq/L (ref 135–145)
Total Bilirubin: 0.6 mg/dL (ref 0.2–1.2)
Total Protein: 6.1 g/dL (ref 6.0–8.3)

## 2024-05-29 LAB — CBC
HCT: 41.5 % (ref 39.0–52.0)
Hemoglobin: 14 g/dL (ref 13.0–17.0)
MCHC: 33.8 g/dL (ref 30.0–36.0)
MCV: 92.1 fl (ref 78.0–100.0)
Platelets: 414 K/uL — ABNORMAL HIGH (ref 150.0–400.0)
RBC: 4.51 Mil/uL (ref 4.22–5.81)
RDW: 13.1 % (ref 11.5–15.5)
WBC: 5.6 K/uL (ref 4.0–10.5)

## 2024-05-29 LAB — LIPID PANEL
Cholesterol: 202 mg/dL — ABNORMAL HIGH (ref 0–200)
HDL: 59.3 mg/dL (ref >39.00)
LDL Cholesterol: 131 mg/dL — ABNORMAL HIGH (ref 0–99)
NonHDL: 143.03
Total CHOL/HDL Ratio: 3
Triglycerides: 61 mg/dL (ref 0.0–149.0)
VLDL: 12.2 mg/dL (ref 0.0–40.0)

## 2024-05-29 LAB — PSA: PSA: 0.99 ng/mL (ref 0.10–4.00)

## 2024-05-29 MED ORDER — ATORVASTATIN CALCIUM 80 MG PO TABS: 80.0000 mg | ORAL_TABLET | Freq: Every day | ORAL | 1 refills | Status: DC

## 2024-05-29 NOTE — Assessment & Plan Note (Signed)
 Patient currently maintained on lifestyle modifications only.  He discontinued Wellbutrin  per his report.  States he is doing well with mood and anxiety.  States exercise is really beneficial in that regard.  Patient denies HI/SI/AVH.  Stable at this juncture

## 2024-05-29 NOTE — Telephone Encounter (Signed)
 Dr. Wilburn Velton Harari in office. He was concerned about his blood pressure. States that cardiac rehab is too. No sympotms other than fatigue. He is only on carvedilol 3.125mg  BID. I told him I would reach out and let you know.  Thanks,  [employer]

## 2024-05-29 NOTE — Progress Notes (Signed)
 Established Patient Office Visit  Subjective   Patient ID: Parker Gomez, male    DOB: August 16, 1976  Age: 48 y.o. MRN: [id number]  Chief Complaint  Patient presents with   Follow-up    Pt requests chest x-ray to see how he is doing. Pt is not sure if he needs to take Lipitor and would like lab work. Pt wants to get diet under control.     HPI  CABG: Patient was last seen by me on 04/17/2024.  At that juncture patient was status post CABG x 2 at catheterization that led to surgery.  I did fill out FMLA and short-term disability for patient for 03/19/2024 through 06/23/2024. States that he is doing cardiac rehab and he is doing that 3 times a week and he is starting to incorperate walking. He is following up with cardiology  States that he has been cleared restirictions on 05/13/2024. He is able to to weed eat  Patient states that he is ran out of his atorvastatin  medication states he does not know the reason was to continue that medication or just take the short course he was given after hospitalization.  GAD: Patient was currently maintained on bupropion  150 mg daily.  There were concern for patient's weight loss at the last office visit.  Patients weight has been stable.  Patient's last office visit with me he weighed 180 pounds most recent weight was 182 pounds outside office. States that he is doing well off the medication.   States that he is urinating frequently. He is peeing 3-4 times a night. He is having trouble sleeping. State that it does not hurt. States that he has flow issues sometimes. State that he is able to get to sleep. He will use melatonin  to get sleep. He feels like he is emptying out all the way. He does have some water through the night when he get. He is urinating normal amount for him throught the day     Review of Systems  Constitutional:  Negative for chills and fever.  Respiratory:  Negative for shortness of breath.   Cardiovascular:  Negative for chest pain.   Neurological:  Negative for headaches.  Psychiatric/Behavioral:  Negative for hallucinations and suicidal ideas.       Objective:     BP 92/60   Pulse 67   Temp 97.7 F (36.5 C) (Oral)   Ht 6' 1 (1.854 m)   Wt 189 lb 6.4 oz (85.9 kg)   SpO2 98%   BMI 24.99 kg/m  BP Readings from Last 3 Encounters:  05/29/24 92/60  05/07/24 (!) 90/58  04/23/24 102/62   Wt Readings from Last 3 Encounters:  05/29/24 189 lb 6.4 oz (85.9 kg)  05/07/24 182 lb (82.6 kg)  04/26/24 180 lb (81.6 kg)   SpO2 Readings from Last 3 Encounters:  05/29/24 98%  04/23/24 99%  04/17/24 98%      Physical Exam   Results for orders placed or performed in visit on 05/29/24  CBC  Result Value Ref Range   WBC 5.6 4.0 - 10.5 K/uL   RBC 4.51 4.22 - 5.81 Mil/uL   Platelets 414.0 (H) 150.0 - 400.0 K/uL   Hemoglobin 14.0 13.0 - 17.0 g/dL   HCT 58.4 60.9 - 47.9 %   MCV 92.1 78.0 - 100.0 fl   MCHC 33.8 30.0 - 36.0 g/dL   RDW 86.8 88.4 - 84.4 %  Comprehensive metabolic panel with GFR  Result Value Ref Range  Sodium 139 135 - 145 mEq/L   Potassium 4.6 3.5 - 5.1 mEq/L   Chloride 101 96 - 112 mEq/L   CO2 32 19 - 32 mEq/L   Glucose, Bld 98 70 - 99 mg/dL   BUN 17 6 - 23 mg/dL   Creatinine, Ser 9.06 0.40 - 1.50 mg/dL   Total Bilirubin 0.6 0.2 - 1.2 mg/dL   Alkaline Phosphatase 80 39 - 117 U/L   AST 13 0 - 37 U/L   ALT 16 0 - 53 U/L   Total Protein 6.1 6.0 - 8.3 g/dL   Albumin 4.6 3.5 - 5.2 g/dL   GFR 02.01 >39.99 mL/min   Calcium  9.9 8.4 - 10.5 mg/dL  Lipid panel  Result Value Ref Range   Cholesterol 202 (H) 0 - 200 mg/dL   Triglycerides 38.9 0.0 - 149.0 mg/dL   HDL 40.69 >60.99 mg/dL   VLDL 87.7 0.0 - 59.9 mg/dL   LDL Cholesterol 868 (H) 0 - 99 mg/dL   Total CHOL/HDL Ratio 3    NonHDL 143.03   PSA  Result Value Ref Range   PSA 0.99 0.10 - 4.00 ng/mL      The 10-year ASCVD risk score (Arnett DK, et al., 2019) is: 3.4%    Assessment & Plan:   Problem List Items Addressed This Visit        Other   Current severe episode of major depressive disorder without psychotic features without prior episode (HCC)   Stable this juncture not currently on any medications.      GAD (generalized anxiety disorder)   Patient currently maintained on lifestyle modifications only.  He discontinued Wellbutrin  per his report.  States he is doing well with mood and anxiety.  States exercise is really beneficial in that regard.  Patient denies HI/SI/AVH.  Stable at this juncture      S/P CABG x 2   Patient states he been cleared activity wise by cardiothoracic surgery.  Still currently doing cardiac rehab 3 times a week.  Patient is still written out of work until 06/23/2024      Relevant Medications   atorvastatin  (LIPITOR) 80 MG tablet   Other Relevant Orders   CBC (Completed)   Comprehensive metabolic panel with GFR (Completed)   Lipid panel (Completed)   Urinary frequency - Primary   Ambiguous in nature.  Likely prostatic in nature will check UA and PSA.  Can consider using tamsulosin but patient's borderline low blood pressure as is.  To use caution      Relevant Orders   PSA (Completed)   Urinalysis w microscopic + reflex cultur   Nocturia   Pending UA and PSA      Relevant Orders   PSA (Completed)    Return in about 3 months (around 08/29/2024) for BP recheck/weight/nocturia .    Adina Crandall, NP

## 2024-05-29 NOTE — Assessment & Plan Note (Signed)
 Patient states he been cleared activity wise by cardiothoracic surgery.  Still currently doing cardiac rehab 3 times a week.  Patient is still written out of work until 06/23/2024

## 2024-05-29 NOTE — Assessment & Plan Note (Signed)
 Ambiguous in nature.  Likely prostatic in nature will check UA and PSA.  Can consider using tamsulosin but patient's borderline low blood pressure as is.  To use caution

## 2024-05-29 NOTE — Patient Instructions (Signed)
 Nice to see you today I will be in touch with the labs and urine results once I have seen them Follow up with me in 3 months, sooner if you need me

## 2024-05-29 NOTE — Assessment & Plan Note (Signed)
 Pending UA and PSA

## 2024-05-29 NOTE — Assessment & Plan Note (Signed)
 Stable this juncture not currently on any medications.

## 2024-05-30 LAB — URINALYSIS W MICROSCOPIC + REFLEX CULTURE
Bacteria, UA: NONE SEEN /HPF
Bilirubin Urine: NEGATIVE
Glucose, UA: NEGATIVE
Hgb urine dipstick: NEGATIVE
Hyaline Cast: NONE SEEN /LPF
Ketones, ur: NEGATIVE
Leukocyte Esterase: NEGATIVE
Nitrites, Initial: NEGATIVE
Protein, ur: NEGATIVE
RBC / HPF: NONE SEEN /HPF (ref 0–2)
Specific Gravity, Urine: 1.007 (ref 1.001–1.035)
Squamous Epithelial / HPF: NONE SEEN /HPF (ref <=5)
WBC, UA: NONE SEEN /HPF (ref 0–5)
pH: 6.5 (ref 5.0–8.0)

## 2024-05-30 LAB — NO CULTURE INDICATED

## 2024-05-31 ENCOUNTER — Encounter (HOSPITAL_COMMUNITY)
Admission: RE | Admit: 2024-05-31 | Discharge: 2024-05-31 | Disposition: A | Discharge status: Home / Self Care | Source: Ambulatory Visit | Attending: Cardiology

## 2024-05-31 DIAGNOSIS — F4323 Adjustment disorder with mixed anxiety and depressed mood: Secondary | ICD-10-CM | POA: Diagnosis not present

## 2024-05-31 DIAGNOSIS — Z951 Presence of aortocoronary bypass graft: Secondary | ICD-10-CM

## 2024-05-31 DIAGNOSIS — Z48812 Encounter for surgical aftercare following surgery on the circulatory system: Secondary | ICD-10-CM | POA: Diagnosis not present

## 2024-06-03 ENCOUNTER — Ambulatory Visit: Payer: Self-pay | Admitting: Nurse Practitioner

## 2024-06-03 ENCOUNTER — Encounter (HOSPITAL_COMMUNITY)
Admission: RE | Admit: 2024-06-03 | Discharge: 2024-06-03 | Disposition: A | Discharge status: Home / Self Care | Source: Ambulatory Visit | Attending: Cardiology

## 2024-06-03 DIAGNOSIS — Z951 Presence of aortocoronary bypass graft: Secondary | ICD-10-CM | POA: Diagnosis not present

## 2024-06-03 DIAGNOSIS — Z48812 Encounter for surgical aftercare following surgery on the circulatory system: Secondary | ICD-10-CM | POA: Diagnosis not present

## 2024-06-03 NOTE — Progress Notes (Signed)
 At 10:15 cardiac rehab exercise class arrival time, Parker Gomez had requested a band-aid from Parker Gomez. Parker brought Parker Gomez to this Clinical research associate so I could assess the cut he sustained while retrieving his items for class. He hit the top of his scalp against the corner of a cabinet in the room where items are set out for exercise class. Cut was approximately 1 and  inch in length, with small amount of bleeding. Area of cut cleaned and Band-Aid applied. Parker Gomez was able to continue with exercise session with no issues.

## 2024-06-04 NOTE — Progress Notes (Signed)
 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  Patient's Home Medications on Admission:  Current Outpatient Medications:    acetaminophen  (TYLENOL ) 500 MG tablet, Take 1,000 mg by mouth every 6 (six) hours as needed (every 6 hours as needed)., Disp: , Rfl:    albuterol  (VENTOLIN  HFA) 108 (90 Base) MCG/ACT inhaler, Inhale 1-2 puffs into the lungs every 6 (six) hours as needed for shortness of breath or wheezing., Disp: , Rfl:    aspirin  EC 81 MG tablet, Take 81 mg by mouth daily., Disp: , Rfl:    atorvastatin  (LIPITOR) 80 MG tablet, Take 1 tablet (80 mg total) by mouth daily., Disp: 90 tablet, Rfl: 1   carvedilol (COREG) 3.125 MG tablet, Take 3.125 mg by mouth 2 (two) times daily with a meal., Disp: , Rfl:    cyclobenzaprine  (FLEXERIL ) 10 MG tablet, Take 1 tablet (10 mg total) by mouth 3 (three) times daily as needed for muscle spasms., Disp: 30 tablet, Rfl: 0   fluticasone (FLONASE) 50 MCG/ACT nasal spray, Place 2 sprays into both nostrils daily as needed for allergies., Disp: , Rfl:    furosemide (LASIX) 20 MG tablet, Take 20 mg by mouth daily. (Patient not taking: Reported on 05/29/2024), Disp: , Rfl:    hydrOXYzine  (ATARAX ) 10 MG tablet, Take 1 tablet (10 mg total) by mouth 3 (three) times daily as needed., Disp: 30 tablet, Rfl: 0   ipratropium (ATROVENT) 0.06 % nasal spray, Place 2 sprays into both nostrils 4 (four) times daily., Disp: , Rfl:    metoprolol tartrate  (LOPRESSOR) 25 MG tablet, Take 25 mg by mouth 2 (two) times daily. (Patient not taking: Reported on 05/29/2024), Disp: , Rfl:    oxyCODONE  (OXY IR/ROXICODONE ) 5 MG immediate release tablet, Take 5 mg by mouth every 6 (six) hours as needed for severe pain (pain score 7-10) (for Pain for up to 7 days)., Disp: , Rfl:    pantoprazole  (PROTONIX ) 40 MG tablet, Take 1 tablet (40 mg total) by mouth daily., Disp: 90 tablet, Rfl: 0   polyethylene glycol (MIRALAX / GLYCOLAX) 17 g packet, Take 17 g by mouth 2 (two) times daily. Mix in 4-8ounces of fluid prior to taking, Disp: , Rfl:    potassium chloride SA (KLOR-CON M) 20 MEQ tablet, Take 20 mEq by mouth 2 (two) times daily., Disp: , Rfl:    sennosides-docusate sodium (SENOKOT-S) 8.6-50 MG tablet, Take 2 tablets by mouth 2 (two) times daily., Disp: , Rfl:    Sennosides-Docusate Sodium 8.6-50 MG CAPS, Take 2 tablets by mouth 2 (two) times daily., Disp: , Rfl:    tadalafil  (CIALIS ) 10 MG tablet, TAKE 1 TABLET (10 MG TOTAL) BY MOUTH EVERY OTHER DAY AS NEEDED FOR ERECTILE DYSFUNCTION., Disp: 6 tablet, Rfl: 3  Past Medical History: Past Medical History:  Diagnosis Date   Spinal stenosis     Tobacco Use: Social History   Tobacco Use  Smoking Status Former   Current packs/day: 0.00   Average packs/day: 1 pack/day for 30.0  years (30.0 ttl pk-yrs)   Types: Cigarettes   Start date: 07/12/1992   Quit date: 07/12/2022   Years since quitting: 1.8  Smokeless Tobacco Never    Labs: Review Flowsheet       Latest Ref Rng & Units 04/06/2023 11/09/2023 05/29/2024  Labs for ITP Cardiac and Pulmonary Rehab  Cholestrol 0 - 200 mg/dL - 816     797   LDL (calc) 0 - 99 mg/dL - 875     868   HDL-C >60.99 mg/dL - 43     40.69   Trlycerides 0.0 - 149.0 mg/dL - 85     38.9   Hemoglobin A1c - 5.8  5.6     -    Details       This result is from an external source.         Capillary Blood Glucose: Lab Results  Component Value Date   GLUCAP 88 01/05/2021      Exercise Target Goals: Exercise Program Goal: Individual exercise prescription set using results from initial 6 min walk test and THRR while considering  patient's activity barriers and safety.   Exercise Prescription Goal: Initial exercise prescription builds to 30-45 minutes a day of aerobic activity, 2-3 days per week.  Home exercise guidelines will be given to patient during program as part of exercise prescription that the participant will acknowledge.  Activity Barriers & Risk Stratification:  Activity Barriers & Cardiac Risk Stratification - 04/23/24 1314       Activity Barriers & Cardiac Risk Stratification   Activity Barriers Arthritis;Back Problems;Neck/Spine Problems;Incisional Pain;Assistive Device;Other (comment)    Comments Back, neck pain, hip pain, bilateral arm/ shoulder pain. No lifting, pushing, pulling until 05/13/24.    Cardiac Risk Stratification High          6 Minute Walk:  6 Minute Walk     Row Name 04/23/24 1433         6 Minute Walk   Phase Initial     Distance 1348 feet     Walk Time 6 minutes     # of Rest Breaks 0     MPH 2.55     METS 4.29     RPE 11     Perceived Dyspnea  1     VO2 Peak 15.02     Symptoms Yes (comment)     Comments Mild shortness of breath. Right hip pain-3/10, left shoulder pain-8/10.     Resting HR 76 bpm     Resting BP 102/62     Resting Oxygen Saturation  99 %     Exercise Oxygen Saturation  during 6 min walk 99 %     Max Ex. HR 86 bpm     Max Ex. BP 112/60     2 Minute Post BP 112/60        Oxygen Initial Assessment:   Oxygen Re-Evaluation:   Oxygen Discharge (Final Oxygen Re-Evaluation):   Initial Exercise Prescription:  Initial Exercise Prescription - 04/23/24 1500       Date of Initial Exercise RX and Referring Provider   Date 04/23/24    Referring Provider Alluri, Keller Grist, MD   Parker Wilbert SAUNDERS, MD (covering)   Expected Discharge Date 06/21/24      Recumbant Bike   Level 1     Minutes 15    METs 2.5      NuStep   Level 1    SPM 85    Minutes 15  METs 2.5      Prescription Details   Frequency (times per week) 3    Duration Progress to 30 minutes of continuous aerobic without signs/symptoms of physical distress      Intensity   THRR 40-80% of Max Heartrate 69-138    Ratings of Perceived Exertion 11-13    Perceived Dyspnea 0-4      Progression   Progression Continue to progress workloads to maintain intensity without signs/symptoms of physical distress.      Resistance Training   Training Prescription No          Perform Capillary Blood Glucose checks as needed.  Exercise Prescription Changes:   Exercise Prescription Changes     Row Name 05/06/24 1028 05/17/24 1032 06/03/24 1034         Response to Exercise   Blood Pressure (Admit) 112/70 98/60 114/68     Blood Pressure (Exercise) 114/74 162/66 162/82     Blood Pressure (Exit) 100/62 92/62 104/60     Heart Rate (Admit) 86 bpm 75 bpm 67 bpm     Heart Rate (Exercise) 100 bpm 110 bpm 105 bpm     Heart Rate (Exit) 89 bpm 82 bpm 76 bpm     Rating of Perceived Exertion (Exercise) 9 11 10      Symptoms None None None     Comments Off to a fair start with exercise. 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 -- --     Duration Continue with 30 min of aerobic exercise without signs/symptoms of physical distress. Continue with 30 min of aerobic exercise without signs/symptoms of physical distress. Continue with 30 min of aerobic exercise without signs/symptoms of physical distress.     Intensity THRR unchanged THRR unchanged THRR unchanged       Progression   Progression Continue to progress workloads to maintain intensity without signs/symptoms of physical distress. Continue to progress workloads to maintain intensity without signs/symptoms of physical distress. Continue to progress workloads to maintain intensity without signs/symptoms of physical distress.     Average METs 2.7 2.7 4        Resistance Training   Training Prescription No Yes Yes     Weight -- 2 lbs 4 lbs     Reps -- 10-15 10-15     Time -- 5 Minutes 5 Minutes       Interval Training   Interval Training No No No       Recumbant Bike   Level 1 4 4      RPM 58 64 76     Watts 41 73 75     Minutes 15 15 15      METs 3.3 4.6 4.8       NuStep   Level 1 4 5      SPM 70 119 95     Minutes 15 15 15      METs 2.1 4.2 3.2       Home Exercise Plan   Plans to continue exercise at -- -- Home (comment)  Walking     Frequency -- -- Add 2 additional days to program exercise sessions.     Initial Home Exercises Provided -- -- 05/22/24        Exercise Comments:   Exercise Comments     Row Name 05/06/24 1127 05/22/24 1054         Exercise Comments Parker Gomez tolerated low intensity exercise fair with some left leg pain. No weights or upper body activity through 05/13/24.  Oriented him to the exercise equipment and stretching exercised with modifications for upper body restrictions. Reviewed home exercise guidelines, METs, and goals with Parker Gomez.         Exercise Goals and Review:   Exercise Goals     Row Name 04/23/24 1314             Exercise Goals   Increase Physical Activity Yes       Intervention Provide advice, education, support and counseling about physical activity/exercise needs.;Develop an individualized exercise prescription for aerobic and resistive training based on initial evaluation findings, risk stratification, comorbidities and participant's personal goals.       Expected Outcomes Short Term: Attend rehab on a regular basis to increase amount of physical activity.;Long Term: Exercising regularly at least 3-5 days a week.;Long Term: Add in home exercise to make exercise part of routine and to increase amount of physical activity.       Increase Strength and Stamina Yes       Intervention Provide advice, education, support and counseling about physical activity/exercise needs.;Develop an  individualized exercise prescription for aerobic and resistive training based on initial evaluation findings, risk stratification, comorbidities and participant's personal goals.       Expected Outcomes Short Term: Increase workloads from initial exercise prescription for resistance, speed, and METs.;Short Term: Perform resistance training exercises routinely during rehab and add in resistance training at home;Long Term: Improve cardiorespiratory fitness, muscular endurance and strength as measured by increased METs and functional capacity ( )       Able to understand and use rate of perceived exertion (RPE) scale Yes       Intervention Provide education and explanation on how to use RPE scale       Expected Outcomes Short Term: Able to use RPE daily in rehab to express subjective intensity level;Long Term:  Able to use RPE to guide intensity level when exercising independently       Knowledge and understanding of Target Heart Rate Range (THRR) Yes       Intervention Provide education and explanation of THRR including how the numbers were predicted and where they are located for reference       Expected Outcomes Short Term: Able to state/look up THRR;Long Term: Able to use THRR to govern intensity when exercising independently;Short Term: Able to use daily as guideline for intensity in rehab       Able to check pulse independently Yes       Intervention Provide education and demonstration on how to check pulse in carotid and radial arteries.;Review the importance of being able to check your own pulse for safety during independent exercise       Expected Outcomes Short Term: Able to explain why pulse checking is important during independent exercise;Long Term: Able to check pulse independently and accurately       Understanding of Exercise Prescription Yes       Intervention Provide education, explanation, and written materials on patient's individual exercise prescription       Expected Outcomes  Short Term: Able to explain program exercise prescription;Long Term: Able to explain home exercise prescription to exercise independently          Exercise Goals Re-Evaluation :  Exercise Goals Re-Evaluation     Row Name 05/06/24 1127 05/22/24 1054 05/27/24 1114 06/03/24 1114       Exercise Goal Re-Evaluation   Exercise Goals Review Increase Physical Activity;Increase Strength and Stamina;Able to understand and use rate of perceived exertion (RPE)  scale Increase Physical Activity;Increase Strength and Stamina;Able to understand and use rate of perceived exertion (RPE) scale;Knowledge and understanding of Target Heart Rate Range (THRR);Understanding of Exercise Prescription Increase Physical Activity;Increase Strength and Stamina;Able to understand and use rate of perceived exertion (RPE) scale;Knowledge and understanding of Target Heart Rate Range (THRR);Understanding of Exercise Prescription Increase Physical Activity;Increase Strength and Stamina;Able to understand and use rate of perceived exertion (RPE) scale;Knowledge and understanding of Target Heart Rate Range (THRR);Understanding of Exercise Prescription    Comments Parker Gomez was able to understand and use RPE scale appropriately. Reviewed exercise prescription with Parker Gomez. His goal is to regain strength and stamina. 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 He has quit smoking 2 months ago and is still successful with tobacco cessation. Able is making good progress with exercise. He anticipates return to work on 06/23/24. He will likely complete the program at that. Parker Gomez is making good progress with exercise. He is still on schedule to return to work on 06/23/24 but would like to continue to attend cardiac rehab on the days he's not working.    Expected Outcomes Progress workloads as tolerated to help increase strength and stamina. Tristen will walk at home in addition to exercise at cardiac rehab to achieve 150 minutes of  aerobic exercise each week. Continue to progress workloads as tolerated to improve cardiorespiratory fitness. Continue to progress workloads as tolerated to improve cardiorespiratory fitness.       Discharge Exercise Prescription (Final Exercise Prescription Changes):  Exercise Prescription Changes - 06/03/24 1034       Response to Exercise   Blood Pressure (Admit) 114/68    Blood Pressure (Exercise) 162/82    Blood Pressure (Exit) 104/60    Heart Rate (Admit) 67 bpm    Heart Rate (Exercise) 105 bpm    Heart Rate (Exit) 76 bpm    Rating of Perceived Exertion (Exercise) 10    Symptoms None    Duration Continue with 30 min of aerobic exercise without signs/symptoms of physical distress.    Intensity THRR unchanged      Progression   Progression Continue to progress workloads to maintain intensity without signs/symptoms of physical distress.    Average METs 4      Resistance Training   Training Prescription Yes    Weight 4 lbs    Reps 10-15    Time 5 Minutes      Interval Training   Interval Training No      Recumbant Bike   Level 4    RPM 76    Watts 75    Minutes 15    METs 4.8      NuStep   Level 5    SPM 95    Minutes 15    METs 3.2      Home Exercise Plan   Plans to continue exercise at Home (comment)   Walking   Frequency Add 2 additional days to program exercise sessions.    Initial Home Exercises Provided 05/22/24          Nutrition:  Target Goals: Understanding of nutrition guidelines, daily intake of sodium 1500mg , cholesterol 200mg , calories 30% from fat and 7% or less from saturated fats, daily to have 5 or more servings of fruits and vegetables.  Biometrics:  Pre Biometrics - 04/23/24 1310       Pre Biometrics   Waist Circumference 33.75 inches    Hip Circumference 39 inches    Waist to Hip  Ratio 0.87 %    Triceps Skinfold 7.5 mm    % Body Fat 19.5 %    Grip Strength 34 kg    Flexibility --   Not performed, chronic back and neck pain    Single Leg Stand 30 seconds           Nutrition Therapy Plan and Nutrition Goals:  Nutrition Therapy & Goals - 06/03/24 1118       Nutrition Therapy   Diet Heart Healthy Diet    Drug/Food Interactions Statins/Certain Fruits      Personal Nutrition Goals   Nutrition Goal Patient to identify strategies for reducing cardiovascular risk by attending the [facility] education and nutrition series weekly.   goal in progress.   Personal Goal #2 Patient to improve diet quality by using the plate method as a guide for meal planning to include lean protein/plant protein, fruits, vegetables, whole grains, nonfat dairy as part of a well-balanced diet.   goal in progress.   Comments Goals in progress. Patient has medical history of CABGx2, toabacco abuse. He continues to attend the [facility] education and nutrition series regularly. LDL is not at goal; he was re-started on 80mg  Lipitor on 05/29/24. He is up 7# since starting with our program; he continues to work on decreasing sweets/ice cream since quitting smoking 2 months ago. Patient will benefit from participation in intensive cardiac rehab and adherence to nutrition, exercise, and lifestyle modification.      Intervention Plan   Intervention Prescribe, educate and counsel regarding individualized specific dietary modifications aiming towards targeted core components such as weight, hypertension, lipid management, diabetes, heart failure and other comorbidities.;Nutrition handout(s) given to patient.    Expected Outcomes Short Term Goal: Understand basic principles of dietary content, such as calories, fat, sodium, cholesterol and nutrients.;Long Term Goal: Adherence to prescribed nutrition plan.          Nutrition Assessments:  MEDIFICTS Score Key: >=70 Need to make dietary changes  40-70 Heart Healthy Diet <= 40 Therapeutic Level Cholesterol Diet    Picture Your Plate Scores: <59 Unhealthy dietary pattern with much room for  improvement. 41-50 Dietary pattern unlikely to meet recommendations for good health and room for improvement. 51-60 More healthful dietary pattern, with some room for improvement.  >60 Healthy dietary pattern, although there may be some specific behaviors that could be improved.    Nutrition Goals Re-Evaluation:  Nutrition Goals Re-Evaluation     Row Name 05/06/24 1144 06/03/24 1118           Goals   Current Weight 184 lb 11.9 oz (83.8 kg) 191 lb 12.8 oz (87 kg)      Comment A1c WNL, LDL 124, HDL 43 LDL 131, cholesterol 202, HDL 59      Expected Outcome Patient has medical history of CABGx2, toabacco abuse. LDL is not at goal. Patient will benefit from participation in intensive cardiac rehab and adherence to nutrition, exercise, and lifestyle modification. Goals in progress. Patient has medical history of CABGx2, toabacco abuse. He continues to attend the [facility] education and nutrition series regularly. LDL is not at goal; he was re-started on 80mg  Lipitor on 05/29/24. He is up 7# since starting with our program; he continues to work on decreasing sweets/ice cream since quitting smoking 2 months ago. Patient will benefit from participation in intensive cardiac rehab and adherence to nutrition, exercise, and lifestyle modification.         Nutrition Goals Re-Evaluation:  Nutrition Goals Re-Evaluation  Row Name 05/06/24 1144 06/03/24 1118           Goals   Current Weight 184 lb 11.9 oz (83.8 kg) 191 lb 12.8 oz (87 kg)      Comment A1c WNL, LDL 124, HDL 43 LDL 131, cholesterol 202, HDL 59      Expected Outcome Patient has medical history of CABGx2, toabacco abuse. LDL is not at goal. Patient will benefit from participation in intensive cardiac rehab and adherence to nutrition, exercise, and lifestyle modification. Goals in progress. Patient has medical history of CABGx2, toabacco abuse. He continues to attend the [facility] education and nutrition series regularly. LDL is not at goal;  he was re-started on 80mg  Lipitor on 05/29/24. He is up 7# since starting with our program; he continues to work on decreasing sweets/ice cream since quitting smoking 2 months ago. Patient will benefit from participation in intensive cardiac rehab and adherence to nutrition, exercise, and lifestyle modification.         Nutrition Goals Discharge (Final Nutrition Goals Re-Evaluation):  Nutrition Goals Re-Evaluation - 06/03/24 1118       Goals   Current Weight 191 lb 12.8 oz (87 kg)    Comment LDL 131, cholesterol 202, HDL 59    Expected Outcome Goals in progress. Patient has medical history of CABGx2, toabacco abuse. He continues to attend the [facility] education and nutrition series regularly. LDL is not at goal; he was re-started on 80mg  Lipitor on 05/29/24. He is up 7# since starting with our program; he continues to work on decreasing sweets/ice cream since quitting smoking 2 months ago. Patient will benefit from participation in intensive cardiac rehab and adherence to nutrition, exercise, and lifestyle modification.          Psychosocial: Target Goals: Acknowledge presence or absence of significant depression and/or stress, maximize coping skills, provide positive support system. Participant is able to verbalize types and ability to use techniques and skills needed for reducing stress and depression.  Initial Review & Psychosocial Screening:  Initial Psych Review & Screening - 04/23/24 1407       Initial Review   Current issues with History of Depression;Current Anxiety/Panic;Current Sleep Concerns;Current Stress Concerns    Source of Stress Concerns Chronic Illness      Family Dynamics   Good Support System? Yes    Comments Parker Gomez has support from his wife and 2 children, his mother, siblings, mother-in-law, and friends. Matisse is in counseling.      Barriers   Psychosocial barriers to participate in program The patient should benefit from training in stress management and  relaxation.;Psychosocial barriers identified (see note)      Screening Interventions   Interventions Encouraged to exercise;To provide support and resources with identified psychosocial needs;Provide feedback about the scores to participant    Expected Outcomes Short Term goal: Utilizing psychosocial counselor, staff and physician to assist with identification of specific Stressors or current issues interfering with healing process. Setting desired goal for each stressor or current issue identified.;Long Term Goal: Stressors or current issues are controlled or eliminated.;Short Term goal: Identification and review with participant of any Quality of Life or Depression concerns found by scoring the questionnaire.;Long Term goal: The participant improves quality of Life and PHQ9 Scores as seen by post scores and/or verbalization of changes          Quality of Life Scores:  Quality of Life - 04/23/24 1543       Quality of Life   Select Quality of  Life      Quality of Life Scores   Health/Function Pre 16.8 %    Socioeconomic Pre 23.14 %    Psych/Spiritual Pre 21.43 %    Family Pre 22.8 %    GLOBAL Pre 19.94 %         Scores of 19 and below usually indicate a poorer quality of life in these areas.  A difference of  2-3 points is a clinically meaningful difference.  A difference of 2-3 points in the total score of the Quality of Life Index has been associated with significant improvement in overall quality of life, self-image, physical symptoms, and general health in studies assessing change in quality of life.  PHQ-9: Review Flowsheet  More data exists      05/29/2024 04/23/2024 04/08/2024 03/22/2024 01/02/2024  Depression screen PHQ 2/9  Decreased Interest 0 1 1 1  0  Down, Depressed, Hopeless 1 0 1 1 0  PHQ - 2 Score 1 1 2 2  0  Altered sleeping 3 3 0 0 1  Tired, decreased energy 1 3 2 2  0  Change in appetite 0 2 3 3 1   Feeling bad or failure about yourself  0 1 1 1  0  Trouble  concentrating 0 0 0 0 0  Moving slowly or fidgety/restless 0 0 0 0 0  Suicidal thoughts 0 0 0 0 0  PHQ-9 Score 5 10 8 8 2   Difficult doing work/chores Not difficult at all Somewhat difficult Not difficult at all Not difficult at all Not difficult at all   Interpretation of Total Score  Total Score Depression Severity:  1-4 = Minimal depression, 5-9 = Mild depression, 10-14 = Moderate depression, 15-19 = Moderately severe depression, 20-27 = Severe depression   Psychosocial Evaluation and Intervention:   Psychosocial Re-Evaluation:  Psychosocial Re-Evaluation     Row Name 05/06/24 1747 05/28/24 1656           Psychosocial Re-Evaluation   Current issues with History of Depression;Current Anxiety/Panic;Current Sleep Concerns;Current Stress Concerns History of Depression;Current Anxiety/Panic;Current Sleep Concerns;Current Stress Concerns      Comments Parker Gomez started cardiac rehab on 05/06/24. Parker Gomez did not have any increased concerns or stressors on his first day of exercise. Will discuss quality of life in the upcoming week Quality of Life reviewed with pt on 6/26. Parker Gomez says he has been feeling better. Parker Gomez reports that he is sleeping better. Parker Gomez says that he plans to return to work in the beginning of August.      Expected Outcomes Parker Gomez will have controlled or decreased depression/ stressors upon completion of cardiac rehab Parker Gomez will have controlled or decreased depression/ stressors upon completion of cardiac rehab      Interventions Stress management education;Encouraged to attend Cardiac Rehabilitation for the exercise;Relaxation education Stress management education;Encouraged to attend Cardiac Rehabilitation for the exercise;Relaxation education      Continue Psychosocial Services  Follow up required by staff No Follow up required        Initial Review   Source of Stress Concerns Family;Unable to participate in former interests or hobbies;Unable to perform  yard/household activities Family;Unable to participate in former interests or hobbies;Unable to perform yard/household activities      Comments Will continue to montior and offer support as needed Will continue to montior and offer support as needed         Psychosocial Discharge (Final Psychosocial Re-Evaluation):  Psychosocial Re-Evaluation - 05/28/24 1656       Psychosocial Re-Evaluation   Current  issues with History of Depression;Current Anxiety/Panic;Current Sleep Concerns;Current Stress Concerns    Comments Quality of Life reviewed with pt on 6/26. Parker Gomez says he has been feeling better. Parker Gomez reports that he is sleeping better. Parker Gomez says that he plans to return to work in the beginning of August.    Expected Outcomes Renell will have controlled or decreased depression/ stressors upon completion of cardiac rehab    Interventions Stress management education;Encouraged to attend Cardiac Rehabilitation for the exercise;Relaxation education    Continue Psychosocial Services  No Follow up required      Initial Review   Source of Stress Concerns Family;Unable to participate in former interests or hobbies;Unable to perform yard/household activities    Comments Will continue to montior and offer support as needed          Vocational Rehabilitation: Provide vocational rehab assistance to qualifying candidates.   Vocational Rehab Evaluation & Intervention:  Vocational Rehab - 04/23/24 1341       Initial Vocational Rehab Evaluation & Intervention   Assessment shows need for Vocational Rehabilitation No      Vocational Rehab Re-Evaulation   Comments Parker Gomez plans to return to work at [employer] center.          Education: Education Goals: Education classes will be provided on a weekly basis, covering required topics. Participant will state understanding/return demonstration of topics presented.    Education     Row Name 05/06/24 1300     Education   Cardiac  Education Topics Pritikin   Geographical information systems officer Psychosocial   Psychosocial Workshop Focused Goals, Sustainable Changes   Instruction Review Code 1- Verbalizes Understanding   Class Start Time 1155   Class Stop Time 1235   Class Time Calculation (min) 40 min    Row Name 05/08/24 1300     Education   Cardiac Education Topics Pritikin   Customer service manager   Weekly Topic Comforting Weekend Breakfasts   Instruction Review Code 1- Verbalizes Understanding   Class Start Time 1145   Class Stop Time 1220   Class Time Calculation (min) 35 min    Row Name 05/15/24 1300     Education   Cardiac Education Topics Pritikin   Customer service manager   Weekly Topic Fast Evening Meals   Instruction Review Code 1- Verbalizes Understanding   Class Start Time 1145   Class Stop Time 1225   Class Time Calculation (min) 40 min    Row Name 05/17/24 1100     Education   Cardiac Education Topics Pritikin   Licensed conveyancer Nutrition   Nutrition Vitamins and Minerals   Instruction Review Code 1- Verbalizes Understanding   Class Start Time 1145   Class Stop Time 1225   Class Time Calculation (min) 40 min    Row Name 05/22/24 1500     Education   Cardiac Education Topics Pritikin   Customer service manager   Weekly Topic International Cuisine- Spotlight on the [employer] Zones   Instruction Review Code 1- Verbalizes Understanding   Class Start Time 1145   Class Stop Time 1225   Class Time Calculation (min) 40 min    Row Name 05/27/24  1100     Education   Cardiac Education Topics Pritikin   Geographical information systems officer Psychosocial   Psychosocial Workshop Healthy Sleep for a Healthy Heart   Instruction Review Code 1-  Verbalizes Understanding   Class Start Time 1150   Class Stop Time 1230   Class Time Calculation (min) 40 min    Row Name 05/29/24 1100     Education   Cardiac Education Topics Pritikin   Customer service manager   Weekly Topic Simple Sides and Sauces   Instruction Review Code 1- Verbalizes Understanding   Class Start Time 1145   Class Stop Time 1225   Class Time Calculation (min) 40 min    Row Name 06/03/24 1200     Education   Cardiac Education Topics Pritikin   Hospital doctor Education   General Education Hypertension and Heart Disease   Instruction Review Code 1- Verbalizes Understanding   Class Start Time 1150   Class Stop Time 1225   Class Time Calculation (min) 35 min      Core Videos: Exercise    Move It!  Clinical staff conducted group or individual video education with verbal and written material and guidebook.  Patient learns the recommended Pritikin exercise program. Exercise with the goal of living a long, healthy life. Some of the health benefits of exercise include controlled diabetes, healthier blood pressure levels, improved cholesterol levels, improved heart and lung capacity, improved sleep, and better body composition. Everyone should speak with their doctor before starting or changing an exercise routine.  Biomechanical Limitations Clinical staff conducted group or individual video education with verbal and written material and guidebook.  Patient learns how biomechanical limitations can impact exercise and how we can mitigate and possibly overcome limitations to have an impactful and balanced exercise routine.  Body Composition Clinical staff conducted group or individual video education with verbal and written material and guidebook.  Patient learns that body composition (ratio of muscle mass to fat mass) is a key component to assessing  overall fitness, rather than body weight alone. Increased fat mass, especially visceral belly fat, can put us  at increased risk for metabolic syndrome, type 2 diabetes, heart disease, and even death. It is recommended to combine diet and exercise (cardiovascular and resistance training) to improve your body composition. Seek guidance from your physician and exercise physiologist before implementing an exercise routine.  Exercise Action Plan Clinical staff conducted group or individual video education with verbal and written material and guidebook.  Patient learns the recommended strategies to achieve and enjoy long-term exercise adherence, including variety, self-motivation, self-efficacy, and positive decision making. Benefits of exercise include fitness, good health, weight management, more energy, better sleep, less stress, and overall well-being.  Medical   Heart Disease Risk Reduction Clinical staff conducted group or individual video education with verbal and written material and guidebook.  Patient learns our heart is our most vital organ as it circulates oxygen, nutrients, white blood cells, and hormones throughout the entire body, and carries waste away. Data supports a plant-based eating plan like the Pritikin Program for its effectiveness in slowing progression of and reversing heart disease. The video provides a number of recommendations to address heart disease.   Metabolic Syndrome and Belly Fat  Clinical staff conducted group or individual video  education with verbal and written material and guidebook.  Patient learns what metabolic syndrome is, how it leads to heart disease, and how one can reverse it and keep it from coming back. You have metabolic syndrome if you have 3 of the following 5 criteria: abdominal obesity, high blood pressure, high triglycerides, low HDL cholesterol, and high blood sugar.  Hypertension and Heart Disease Clinical staff conducted group or individual video  education with verbal and written material and guidebook.  Patient learns that high blood pressure, or hypertension, is very common in the [place] . Hypertension is largely due to excessive salt intake, but other important risk factors include being overweight, physical inactivity, drinking too much alcohol, smoking, and not eating enough potassium from fruits and vegetables. High blood pressure is a leading risk factor for heart attack, stroke, congestive heart failure, dementia, kidney failure, and premature death. Long-term effects of excessive salt intake include stiffening of the arteries and thickening of heart muscle and organ damage. Recommendations include ways to reduce hypertension and the risk of heart disease.  Diseases of Our Time - Focusing on Diabetes Clinical staff conducted group or individual video education with verbal and written material and guidebook.  Patient learns why the best way to stop diseases of our time is prevention, through food and other lifestyle changes. Medicine (such as prescription pills and surgeries) is often only a Band-Aid on the problem, not a long-term solution. Most common diseases of our time include obesity, type 2 diabetes, hypertension, heart disease, and cancer. The Pritikin Program is recommended and has been proven to help reduce, reverse, and/or prevent the damaging effects of metabolic syndrome.  Nutrition   Overview of the Pritikin Eating Plan  Clinical staff conducted group or individual video education with verbal and written material and guidebook.  Patient learns about the Pritikin Eating Plan for disease risk reduction. The Pritikin Eating Plan emphasizes a wide variety of unrefined, minimally-processed carbohydrates, like fruits, vegetables, whole grains, and legumes. Go, Caution, and Stop food choices are explained. Plant-based and lean animal proteins are emphasized. Rationale provided for low sodium intake for blood pressure control,  low added sugars for blood sugar stabilization, and low added fats and oils for coronary artery disease risk reduction and weight management.  Calorie Density  Clinical staff conducted group or individual video education with verbal and written material and guidebook.  Patient learns about calorie density and how it impacts the Pritikin Eating Plan. Knowing the characteristics of the food you choose will help you decide whether those foods will lead to weight gain or weight loss, and whether you want to consume more or less of them. Weight loss is usually a side effect of the Pritikin Eating Plan because of its focus on low calorie-dense foods.  Label Reading  Clinical staff conducted group or individual video education with verbal and written material and guidebook.  Patient learns about the Pritikin recommended label reading guidelines and corresponding recommendations regarding calorie density, added sugars, sodium content, and whole grains.  Dining Out - Part 1  Clinical staff conducted group or individual video education with verbal and written material and guidebook.  Patient learns that restaurant meals can be sabotaging because they can be so high in calories, fat, sodium, and/or sugar. Patient learns recommended strategies on how to positively address this and avoid unhealthy pitfalls.  Facts on Fats  Clinical staff conducted group or individual video education with verbal and written material and guidebook.  Patient learns that lifestyle modifications can  be just as effective, if not more so, as many medications for lowering your risk of heart disease. A Pritikin lifestyle can help to reduce your risk of inflammation and atherosclerosis (cholesterol build-up, or plaque, in the artery walls). Lifestyle interventions such as dietary choices and physical activity address the cause of atherosclerosis. A review of the types of fats and their impact on blood cholesterol levels, along with dietary  recommendations to reduce fat intake is also included.  Nutrition Action Plan  Clinical staff conducted group or individual video education with verbal and written material and guidebook.  Patient learns how to incorporate Pritikin recommendations into their lifestyle. Recommendations include planning and keeping personal health goals in mind as an important part of their success.  Healthy Mind-Set    Healthy Minds, Bodies, Hearts  Clinical staff conducted group or individual video education with verbal and written material and guidebook.  Patient learns how to identify when they are stressed. Video will discuss the impact of that stress, as well as the many benefits of stress management. Patient will also be introduced to stress management techniques. The way we think, act, and feel has an impact on our hearts.  How Our Thoughts Can Heal Our Hearts  Clinical staff conducted group or individual video education with verbal and written material and guidebook.  Patient learns that negative thoughts can cause depression and anxiety. This can result in negative lifestyle behavior and serious health problems. Cognitive behavioral therapy is an effective method to help control our thoughts in order to change and improve our emotional outlook.  Additional Videos:  Exercise    Improving Performance  Clinical staff conducted group or individual video education with verbal and written material and guidebook.  Patient learns to use a non-linear approach by alternating intensity levels and lengths of time spent exercising to help burn more calories and lose more body fat. Cardiovascular exercise helps improve heart health, metabolism, hormonal balance, blood sugar control, and recovery from fatigue. Resistance training improves strength, endurance, balance, coordination, reaction time, metabolism, and muscle mass. Flexibility exercise improves circulation, posture, and balance. Seek guidance from your  physician and exercise physiologist before implementing an exercise routine and learn your capabilities and proper form for all exercise.  Introduction to Yoga  Clinical staff conducted group or individual video education with verbal and written material and guidebook.  Patient learns about yoga, a discipline of the coming together of mind, breath, and body. The benefits of yoga include improved flexibility, improved range of motion, better posture and core strength, increased lung function, weight loss, and positive self-image. Yoga's heart health benefits include lowered blood pressure, healthier heart rate, decreased cholesterol and triglyceride levels, improved immune function, and reduced stress. Seek guidance from your physician and exercise physiologist before implementing an exercise routine and learn your capabilities and proper form for all exercise.  Medical   Aging: Enhancing Your Quality of Life  Clinical staff conducted group or individual video education with verbal and written material and guidebook.  Patient learns key strategies and recommendations to stay in good physical health and enhance quality of life, such as prevention strategies, having an advocate, securing a Health Care Proxy and Power of Attorney, and keeping a list of medications and system for tracking them. It also discusses how to avoid risk for bone loss.  Biology of Weight Control  Clinical staff conducted group or individual video education with verbal and written material and guidebook.  Patient learns that weight gain occurs because we consume  more calories than we burn (eating more, moving less). Even if your body weight is normal, you may have higher ratios of fat compared to muscle mass. Too much body fat puts you at increased risk for cardiovascular disease, heart attack, stroke, type 2 diabetes, and obesity-related cancers. In addition to exercise, following the Pritikin Eating Plan can help reduce your  risk.  Decoding Lab Results  Clinical staff conducted group or individual video education with verbal and written material and guidebook.  Patient learns that lab test reflects one measurement whose values change over time and are influenced by many factors, including medication, stress, sleep, exercise, food, hydration, pre-existing medical conditions, and more. It is recommended to use the knowledge from this video to become more involved with your lab results and evaluate your numbers to speak with your doctor.   Diseases of Our Time - Overview  Clinical staff conducted group or individual video education with verbal and written material and guidebook.  Patient learns that according to the CDC, 50% to 70% of chronic diseases (such as obesity, type 2 diabetes, elevated lipids, hypertension, and heart disease) are avoidable through lifestyle improvements including healthier food choices, listening to satiety cues, and increased physical activity.  Sleep Disorders Clinical staff conducted group or individual video education with verbal and written material and guidebook.  Patient learns how good quality and duration of sleep are important to overall health and well-being. Patient also learns about sleep disorders and how they impact health along with recommendations to address them, including discussing with a physician.  Nutrition  Dining Out - Part 2 Clinical staff conducted group or individual video education with verbal and written material and guidebook.  Patient learns how to plan ahead and communicate in order to maximize their dining experience in a healthy and nutritious manner. Included are recommended food choices based on the type of restaurant the patient is visiting.   Fueling a Banker conducted group or individual video education with verbal and written material and guidebook.  There is a strong connection between our food choices and our health. Diseases  like obesity and type 2 diabetes are very prevalent and are in large-part due to lifestyle choices. The Pritikin Eating Plan provides plenty of food and hunger-curbing satisfaction. It is easy to follow, affordable, and helps reduce health risks.  Menu Workshop  Clinical staff conducted group or individual video education with verbal and written material and guidebook.  Patient learns that restaurant meals can sabotage health goals because they are often packed with calories, fat, sodium, and sugar. Recommendations include strategies to plan ahead and to communicate with the manager, chef, or server to help order a healthier meal.  Planning Your Eating Strategy  Clinical staff conducted group or individual video education with verbal and written material and guidebook.  Patient learns about the Pritikin Eating Plan and its benefit of reducing the risk of disease. The Pritikin Eating Plan does not focus on calories. Instead, it emphasizes high-quality, nutrient-rich foods. By knowing the characteristics of the foods, we choose, we can determine their calorie density and make informed decisions.  Targeting Your Nutrition Priorities  Clinical staff conducted group or individual video education with verbal and written material and guidebook.  Patient learns that lifestyle habits have a tremendous impact on disease risk and progression. This video provides eating and physical activity recommendations based on your personal health goals, such as reducing LDL cholesterol, losing weight, preventing or controlling type 2 diabetes, and reducing  high blood pressure.  Vitamins and Minerals  Clinical staff conducted group or individual video education with verbal and written material and guidebook.  Patient learns different ways to obtain key vitamins and minerals, including through a recommended healthy diet. It is important to discuss all supplements you take with your doctor.   Healthy Mind-Set    Smoking  Cessation  Clinical staff conducted group or individual video education with verbal and written material and guidebook.  Patient learns that cigarette smoking and tobacco addiction pose a serious health risk which affects millions of people. Stopping smoking will significantly reduce the risk of heart disease, lung disease, and many forms of cancer. Recommended strategies for quitting are covered, including working with your doctor to develop a successful plan.  Culinary   Becoming a Set designer conducted group or individual video education with verbal and written material and guidebook.  Patient learns that cooking at home can be healthy, cost-effective, quick, and puts them in control. Keys to cooking healthy recipes will include looking at your recipe, assessing your equipment needs, planning ahead, making it simple, choosing cost-effective seasonal ingredients, and limiting the use of added fats, salts, and sugars.  Cooking - Breakfast and Snacks  Clinical staff conducted group or individual video education with verbal and written material and guidebook.  Patient learns how important breakfast is to satiety and nutrition through the entire day. Recommendations include key foods to eat during breakfast to help stabilize blood sugar levels and to prevent overeating at meals later in the day. Planning ahead is also a key component.  Cooking - Educational psychologist conducted group or individual video education with verbal and written material and guidebook.  Patient learns eating strategies to improve overall health, including an approach to cook more at home. Recommendations include thinking of animal protein as a side on your plate rather than center stage and focusing instead on lower calorie dense options like vegetables, fruits, whole grains, and plant-based proteins, such as beans. Making sauces in large quantities to freeze for later and leaving the skin on your  vegetables are also recommended to maximize your experience.  Cooking - Healthy Salads and Dressing Clinical staff conducted group or individual video education with verbal and written material and guidebook.  Patient learns that vegetables, fruits, whole grains, and legumes are the foundations of the Pritikin Eating Plan. Recommendations include how to incorporate each of these in flavorful and healthy salads, and how to create homemade salad dressings. Proper handling of ingredients is also covered. Cooking - Soups and [employer] - Soups and Desserts Clinical staff conducted group or individual video education with verbal and written material and guidebook.  Patient learns that Pritikin soups and desserts make for easy, nutritious, and delicious snacks and meal components that are low in sodium, fat, sugar, and calorie density, while high in vitamins, minerals, and filling fiber. Recommendations include simple and healthy ideas for soups and desserts.   Overview     The Pritikin Solution Program Overview Clinical staff conducted group or individual video education with verbal and written material and guidebook.  Patient learns that the results of the Pritikin Program have been documented in more than 100 articles published in peer-reviewed journals, and the benefits include reducing risk factors for (and, in some cases, even reversing) high cholesterol, high blood pressure, type 2 diabetes, obesity, and more! An overview of the three key pillars of the Pritikin Program will be covered: eating well,  doing regular exercise, and having a healthy mind-set.  WORKSHOPS  Exercise: Exercise Basics: Building Your Action Plan Clinical staff led group instruction and group discussion with PowerPoint presentation and patient guidebook. To enhance the learning environment the use of posters, models and videos may be added. At the conclusion of this workshop, patients will comprehend the  difference between physical activity and exercise, as well as the benefits of incorporating both, into their routine. Patients will understand the FITT (Frequency, Intensity, Time, and Type) principle and how to use it to build an exercise action plan. In addition, safety concerns and other considerations for exercise and cardiac rehab will be addressed by the presenter. The purpose of this lesson is to promote a comprehensive and effective weekly exercise routine in order to improve patients' overall level of fitness.   Managing Heart Disease: Your Path to a Healthier Heart Clinical staff led group instruction and group discussion with PowerPoint presentation and patient guidebook. To enhance the learning environment the use of posters, models and videos may be added.At the conclusion of this workshop, patients will understand the anatomy and physiology of the heart. Additionally, they will understand how Pritikin's three pillars impact the risk factors, the progression, and the management of heart disease.  The purpose of this lesson is to provide a high-level overview of the heart, heart disease, and how the Pritikin lifestyle positively impacts risk factors.  Exercise Biomechanics Clinical staff led group instruction and group discussion with PowerPoint presentation and patient guidebook. To enhance the learning environment the use of posters, models and videos may be added. Patients will learn how the structural parts of their bodies function and how these functions impact their daily activities, movement, and exercise. Patients will learn how to promote a neutral spine, learn how to manage pain, and identify ways to improve their physical movement in order to promote healthy living. The purpose of this lesson is to expose patients to common physical limitations that impact physical activity. Participants will learn practical ways to adapt and manage aches and pains, and to minimize their  effect on regular exercise. Patients will learn how to maintain good posture while sitting, walking, and lifting.  Balance Training and Fall Prevention  Clinical staff led group instruction and group discussion with PowerPoint presentation and patient guidebook. To enhance the learning environment the use of posters, models and videos may be added. At the conclusion of this workshop, patients will understand the importance of their sensorimotor skills (vision, proprioception, and the vestibular system) in maintaining their ability to balance as they age. Patients will apply a variety of balancing exercises that are appropriate for their current level of function. Patients will understand the common causes for poor balance, possible solutions to these problems, and ways to modify their physical environment in order to minimize their fall risk. The purpose of this lesson is to teach patients about the importance of maintaining balance as they age and ways to minimize their risk of falling.  WORKSHOPS   Nutrition:  Fueling a Ship broker led group instruction and group discussion with PowerPoint presentation and patient guidebook. To enhance the learning environment the use of posters, models and videos may be added. Patients will review the foundational principles of the Pritikin Eating Plan and understand what constitutes a serving size in each of the food groups. Patients will also learn Pritikin-friendly foods that are better choices when away from home and review make-ahead meal and snack options. Calorie density will be reviewed and  applied to three nutrition priorities: weight maintenance, weight loss, and weight gain. The purpose of this lesson is to reinforce (in a group setting) the key concepts around what patients are recommended to eat and how to apply these guidelines when away from home by planning and selecting Pritikin-friendly options. Patients will understand how calorie  density may be adjusted for different weight management goals.  Mindful Eating  Clinical staff led group instruction and group discussion with PowerPoint presentation and patient guidebook. To enhance the learning environment the use of posters, models and videos may be added. Patients will briefly review the concepts of the Pritikin Eating Plan and the importance of low-calorie dense foods. The concept of mindful eating will be introduced as well as the importance of paying attention to internal hunger signals. Triggers for non-hunger eating and techniques for dealing with triggers will be explored. The purpose of this lesson is to provide patients with the opportunity to review the basic principles of the Pritikin Eating Plan, discuss the value of eating mindfully and how to measure internal cues of hunger and fullness using the Hunger Scale. Patients will also discuss reasons for non-hunger eating and learn strategies to use for controlling emotional eating.  Targeting Your Nutrition Priorities Clinical staff led group instruction and group discussion with PowerPoint presentation and patient guidebook. To enhance the learning environment the use of posters, models and videos may be added. Patients will learn how to determine their genetic susceptibility to disease by reviewing their family history. Patients will gain insight into the importance of diet as part of an overall healthy lifestyle in mitigating the impact of genetics and other environmental insults. The purpose of this lesson is to provide patients with the opportunity to assess their personal nutrition priorities by looking at their family history, their own health history and current risk factors. Patients will also be able to discuss ways of prioritizing and modifying the Pritikin Eating Plan for their highest risk areas  Menu  Clinical staff led group instruction and group discussion with PowerPoint presentation and patient guidebook. To  enhance the learning environment the use of posters, models and videos may be added. Using menus brought in from [employer], or printed from [employer], patients will apply the Pritikin dining out guidelines that were presented in the [employer] video. Patients will also be able to practice these guidelines in a variety of provided scenarios. The purpose of this lesson is to provide patients with the opportunity to practice hands-on learning of the Pritikin Dining Out guidelines with actual menus and practice scenarios.  Label Reading Clinical staff led group instruction and group discussion with PowerPoint presentation and patient guidebook. To enhance the learning environment the use of posters, models and videos may be added. Patients will review and discuss the Pritikin label reading guidelines presented in Pritikin's Label Reading Educational series video. Using fool labels brought in from local grocery stores and markets, patients will apply the label reading guidelines and determine if the packaged food meet the Pritikin guidelines. The purpose of this lesson is to provide patients with the opportunity to review, discuss, and practice hands-on learning of the Pritikin Label Reading guidelines with actual packaged food labels. Cooking School  Pritikin's [employer] are designed to teach patients ways to prepare quick, simple, and affordable recipes at home. The importance of nutrition's role in chronic disease risk reduction is reflected in its emphasis in the overall Pritikin program. By learning how to prepare essential core  Pritikin Eating Plan recipes, patients will increase control over what they eat; be able to customize the flavor of foods without the use of added salt, sugar, or fat; and improve the quality of the food they consume. By learning a set of core recipes which are easily assembled, quickly prepared, and affordable, patients are more likely to  prepare more healthy foods at home. These workshops focus on convenient breakfasts, simple entres, side dishes, and desserts which can be prepared with minimal effort and are consistent with nutrition recommendations for cardiovascular risk reduction. Cooking [employer] are taught by a Armed forces logistics/support/administrative officer (RD) who has been trained by the [employer]. The chef or RD has a clear understanding of the importance of minimizing - if not completely eliminating - added fat, sugar, and sodium in recipes. Throughout the series of Cooking School Workshop sessions, patients will learn about healthy ingredients and efficient methods of cooking to build confidence in their capability to prepare    Cooking School weekly topics:  Adding Flavor- Sodium-Free  Fast and Healthy Breakfasts  Powerhouse Plant-Based Proteins  Satisfying Salads and Dressings  Simple Sides and Sauces  International Cuisine-Spotlight on the [employer] Zones  Delicious Desserts  Savory Soups  [employer] - Meals in a Astronomer Appetizers and Snacks  Comforting Weekend Breakfasts  One-Pot Wonders   Fast Evening Meals  Landscape architect Your Pritikin Plate  WORKSHOPS   Healthy Mindset (Psychosocial):  Focused Goals, Sustainable Changes Clinical staff led group instruction and group discussion with PowerPoint presentation and patient guidebook. To enhance the learning environment the use of posters, models and videos may be added. Patients will be able to apply effective goal setting strategies to establish at least one personal goal, and then take consistent, meaningful action toward that goal. They will learn to identify common barriers to achieving personal goals and develop strategies to overcome them. Patients will also gain an understanding of how our mind-set can impact our ability to achieve goals and the importance of cultivating a positive and growth-oriented mind-set. The purpose  of this lesson is to provide patients with a deeper understanding of how to set and achieve personal goals, as well as the tools and strategies needed to overcome common obstacles which may arise along the way.  From Head to Heart: The Power of a Healthy Outlook  Clinical staff led group instruction and group discussion with PowerPoint presentation and patient guidebook. To enhance the learning environment the use of posters, models and videos may be added. Patients will be able to recognize and describe the impact of emotions and mood on physical health. They will discover the importance of self-care and explore self-care practices which may work for them. Patients will also learn how to utilize the 4 C's to cultivate a healthier outlook and better manage stress and challenges. The purpose of this lesson is to demonstrate to patients how a healthy outlook is an essential part of maintaining good health, especially as they continue their cardiac rehab journey.  Healthy Sleep for a Healthy Heart Clinical staff led group instruction and group discussion with PowerPoint presentation and patient guidebook. To enhance the learning environment the use of posters, models and videos may be added. At the conclusion of this workshop, patients will be able to demonstrate knowledge of the importance of sleep to overall health, well-being, and quality of life. They will understand the symptoms of, and treatments for, common sleep disorders. Patients will also be able  to identify daytime and nighttime behaviors which impact sleep, and they will be able to apply these tools to help manage sleep-related challenges. The purpose of this lesson is to provide patients with a general overview of sleep and outline the importance of quality sleep. Patients will learn about a few of the most common sleep disorders. Patients will also be introduced to the concept of "sleep hygiene," and discover ways to self-manage certain sleeping  problems through simple daily behavior changes. Finally, the workshop will motivate patients by clarifying the links between quality sleep and their goals of heart-healthy living.   Recognizing and Reducing Stress Clinical staff led group instruction and group discussion with PowerPoint presentation and patient guidebook. To enhance the learning environment the use of posters, models and videos may be added. At the conclusion of this workshop, patients will be able to understand the types of stress reactions, differentiate between acute and chronic stress, and recognize the impact that chronic stress has on their health. They will also be able to apply different coping mechanisms, such as reframing negative self-talk. Patients will have the opportunity to practice a variety of stress management techniques, such as deep abdominal breathing, progressive muscle relaxation, and/or guided imagery.  The purpose of this lesson is to educate patients on the role of stress in their lives and to provide healthy techniques for coping with it.  Learning Barriers/Preferences:  Learning Barriers/Preferences - 04/23/24 1340       Learning Barriers/Preferences   Learning Barriers None    Learning Preferences Computer/Internet;Written Material;Video;Skilled Demonstration          Education Topics:  Knowledge Questionnaire Score:  Knowledge Questionnaire Score - 04/23/24 1543       Knowledge Questionnaire Score   Pre Score 25/28          Core Components/Risk Factors/Patient Goals at Admission:  Personal Goals and Risk Factors at Admission - 04/23/24 1341       Core Components/Risk Factors/Patient Goals on Admission    Weight Management Weight Maintenance;Yes    Intervention Weight Management: Develop a combined nutrition and exercise program designed to reach desired caloric intake, while maintaining appropriate intake of nutrient and fiber, sodium and fats, and appropriate energy expenditure  required for the weight goal.;Weight Management: Provide education and appropriate resources to help participant work on and attain dietary goals.    Admit Weight 184 lb 11.9 oz (83.8 kg)    Expected Outcomes Short Term: Continue to assess and modify interventions until short term weight is achieved;Long Term: Adherence to nutrition and physical activity/exercise program aimed toward attainment of established weight goal;Weight Maintenance: Understanding of the daily nutrition guidelines, which includes 25-35% calories from fat, 7% or less cal from saturated fats, less than 200mg  cholesterol, less than 1.5gm of sodium, & 5 or more servings of fruits and vegetables daily    Tobacco Cessation Yes    Number of packs per day 1.25    Expected Outcomes Long Term: Complete abstinence from all tobacco products for at least 12 months from quit date.    Hypertension Yes    Intervention Provide education on lifestyle modifcations including regular physical activity/exercise, weight management, moderate sodium restriction and increased consumption of fresh fruit, vegetables, and low fat dairy, alcohol moderation, and smoking cessation.;Monitor prescription use compliance.    Expected Outcomes Short Term: Continued assessment and intervention until BP is < 140/69mm HG in hypertensive participants. < 130/51mm HG in hypertensive participants with diabetes, heart failure or chronic kidney disease.;Long Term: Maintenance  of blood pressure at goal levels.    Lipids Yes    Intervention Provide education and support for participant on nutrition & aerobic/resistive exercise along with prescribed medications to achieve LDL 70mg , HDL >40mg .    Expected Outcomes Short Term: Participant states understanding of desired cholesterol values and is compliant with medications prescribed. Participant is following exercise prescription and nutrition guidelines.;Long Term: Cholesterol controlled with medications as prescribed, with  individualized exercise RX and with personalized nutrition plan. Value goals: LDL < 70mg , HDL > 40 mg.    Stress Yes    Intervention Offer individual and/or small group education and counseling on adjustment to heart disease, stress management and health-related lifestyle change. Teach and support self-help strategies.;Refer participants experiencing significant psychosocial distress to appropriate mental health specialists for further evaluation and treatment. When possible, include family members and significant others in education/counseling sessions.    Expected Outcomes Short Term: Participant demonstrates changes in health-related behavior, relaxation and other stress management skills, ability to obtain effective social support, and compliance with psychotropic medications if prescribed.;Long Term: Emotional wellbeing is indicated by absence of clinically significant psychosocial distress or social isolation.          Core Components/Risk Factors/Patient Goals Review:   Goals and Risk Factor Review     Row Name 05/06/24 1753 05/28/24 1657           Core Components/Risk Factors/Patient Goals Review   Personal Goals Review Weight Management/Obesity;Hypertension;Lipids;Stress;Tobacco Cessation Weight Management/Obesity;Hypertension;Lipids;Stress;Tobacco Cessation      Review Parker Gomez started cardiac rehab on 05/06/24. Parker Gomez did well with exercise. Vital signs were stable. Parker Gomez did report having some sternal soreness. Advised patient to premedicate with tylenol   priot to coming to exercise, Pt is doing well with exercise at cardiac rehab.  VSS.  Pt has gained 3.2kg since start of program.  MET levels have increased.      Expected Outcomes Parker Gomez will continue to particpate in cardiac rehab for exercise, nutrition and lifestyle modifications Parker Gomez will continue to particpate in cardiac rehab for exercise, nutrition and lifestyle modifications         Core Components/Risk  Factors/Patient Goals at Discharge (Final Review):   Goals and Risk Factor Review - 05/28/24 1657       Core Components/Risk Factors/Patient Goals Review   Personal Goals Review Weight Management/Obesity;Hypertension;Lipids;Stress;Tobacco Cessation    Review Pt is doing well with exercise at cardiac rehab.  VSS.  Pt has gained 3.2kg since start of program.  MET levels have increased.    Expected Outcomes Parker Gomez will continue to particpate in cardiac rehab for exercise, nutrition and lifestyle modifications          ITP Comments:  ITP Comments     Row Name 04/23/24 1310 05/06/24 1127 05/28/24 1649       ITP Comments Medical Director- Dr. Wilbert Bihari, MD. Introduction to the Pritikin Education / Intensive Cardiac Rehab Program. Reviewed initial orientation folder with Lamarius. 30 day ITP review. Sampson started cardiac rehab today and tolerated low intensity exercise well. He has lifting restrictions through 05/13/24. Exercise modified accordingly, no weights used this week. No arm use on the recumbent stepper or bike. 30 day ITP review. Pt has good attendance and participation with exercise at cardiac rehab.        Comments: See ITP Comments

## 2024-06-05 ENCOUNTER — Encounter (HOSPITAL_COMMUNITY)
Admission: RE | Admit: 2024-06-05 | Discharge: 2024-06-05 | Disposition: A | Discharge status: Home / Self Care | Source: Ambulatory Visit | Attending: Cardiology | Admitting: Cardiology

## 2024-06-05 DIAGNOSIS — Z951 Presence of aortocoronary bypass graft: Secondary | ICD-10-CM

## 2024-06-05 DIAGNOSIS — Z48812 Encounter for surgical aftercare following surgery on the circulatory system: Secondary | ICD-10-CM | POA: Diagnosis not present

## 2024-06-06 ENCOUNTER — Telehealth: Payer: Self-pay | Admitting: Nurse Practitioner

## 2024-06-06 NOTE — Telephone Encounter (Signed)
 Copied from CRM [id number]. Topic: General - Other >> Jun 06, 2024  1:28 PM Drema MATSU wrote: Reason for CRM: Patient is requesting a return to work letter to return to work on 06/20/2024. He said that he can come by and pick it up. He has to return back to work 2 days early to keep your work benefits.

## 2024-06-07 ENCOUNTER — Telehealth: Payer: Self-pay | Admitting: Nurse Practitioner

## 2024-06-07 ENCOUNTER — Encounter (HOSPITAL_COMMUNITY)
Admission: RE | Admit: 2024-06-07 | Discharge: 2024-06-07 | Disposition: A | Discharge status: Home / Self Care | Source: Ambulatory Visit | Attending: Cardiology | Admitting: Cardiology

## 2024-06-07 DIAGNOSIS — Z951 Presence of aortocoronary bypass graft: Secondary | ICD-10-CM

## 2024-06-07 DIAGNOSIS — Z48812 Encounter for surgical aftercare following surgery on the circulatory system: Secondary | ICD-10-CM | POA: Diagnosis not present

## 2024-06-07 NOTE — Telephone Encounter (Signed)
 Copied from CRM [id number]. Topic: General - Other >> Jun 07, 2024  4:22 PM Chasity T wrote: Reason for CRM: Patient is calling to see if Dr Wendee can write him a note to return back to work on 06/20/24. Please contact patient regarding request

## 2024-06-10 ENCOUNTER — Telehealth: Payer: Self-pay | Admitting: Nurse Practitioner

## 2024-06-10 ENCOUNTER — Encounter (HOSPITAL_COMMUNITY): Admission: RE | Admit: 2024-06-10 | Source: Ambulatory Visit

## 2024-06-10 NOTE — Telephone Encounter (Signed)
 Form placed in providers inbox

## 2024-06-10 NOTE — Telephone Encounter (Signed)
 He has an appointment on 06/18/2024 for returning to work I thought?

## 2024-06-10 NOTE — Telephone Encounter (Signed)
 Patient dropped off return to work ppwk, needs completed prior to 06/20/24 and faxed back to [employer] office.  Has a scheduled appt 06/18/24. Placed in providers box for completion. States he needs this in order to keep benefits.

## 2024-06-12 ENCOUNTER — Encounter (HOSPITAL_COMMUNITY): Admission: RE | Admit: 2024-06-12 | Source: Ambulatory Visit

## 2024-06-13 ENCOUNTER — Telehealth: Payer: Self-pay

## 2024-06-13 NOTE — Patient Instructions (Signed)
 Parker Gomez - I am sorry I was unable to reach you today for our scheduled appointment. I work with Wendee, Lynwood HERO, NP and am calling to support your healthcare needs. Please contact the Care Guide to reschedule at your earliest convenience: [phone].  I look forward to speaking with you soon.   Thank you,  Nestora Duos, MSN, RN [facility]  [facility], [facility] RN Care Manager Direct Dial: [phone] Fax: [phone]

## 2024-06-14 ENCOUNTER — Encounter (HOSPITAL_COMMUNITY): Admission: RE | Admit: 2024-06-14 | Source: Ambulatory Visit

## 2024-06-17 ENCOUNTER — Encounter (HOSPITAL_COMMUNITY)
Admission: RE | Admit: 2024-06-17 | Discharge: 2024-06-17 | Disposition: A | Discharge status: Home / Self Care | Source: Ambulatory Visit | Attending: Cardiology | Admitting: Cardiology

## 2024-06-17 VITALS — Ht 73.0 in | Wt 196.7 lb

## 2024-06-17 DIAGNOSIS — Z951 Presence of aortocoronary bypass graft: Secondary | ICD-10-CM

## 2024-06-17 DIAGNOSIS — Z48812 Encounter for surgical aftercare following surgery on the circulatory system: Secondary | ICD-10-CM | POA: Diagnosis not present

## 2024-06-18 ENCOUNTER — Ambulatory Visit (INDEPENDENT_AMBULATORY_CARE_PROVIDER_SITE_OTHER): Admitting: Nurse Practitioner

## 2024-06-18 VITALS — BP 102/62 | HR 63 | Temp 98.1°F | Ht 73.0 in | Wt 195.0 lb

## 2024-06-18 DIAGNOSIS — Z951 Presence of aortocoronary bypass graft: Secondary | ICD-10-CM | POA: Diagnosis not present

## 2024-06-18 MED ORDER — NICOTINE 7 MG/24HR TD PT24: 7.0000 mg | MEDICATED_PATCH | Freq: Every day | TRANSDERMAL | 0 refills | Status: DC

## 2024-06-18 NOTE — Assessment & Plan Note (Signed)
 Patient is postop status post CABG x 2.  Was cleared by cardiothoracic surgery on 05/13/2024 for return to all activity.  Patient still participating cardiac rehab.  Patient is states going back to work on 06/20/2024.  Form filled out to clear patient along with getting note from the office.  No restrictions at this juncture.  Patient does have a physical job if patient is having difficult time in the beginning he will come back we will consider restrictions such as increased breaks or reducing his duties.

## 2024-06-18 NOTE — Progress Notes (Signed)
 Acute Office Visit  Subjective:     Patient ID: Parker Gomez, male    DOB: 20-Jun-1976, 48 y.o.   MRN: [id number]  Chief Complaint  Patient presents with   Follow-up    Pt complains of need for return to work note, copy of ppw    HPI Patient is in today for return to work  Patient underwent CABG x 2 on 03/27/2024 after undergoing cardiac cath.  Patient was written out for 3 months for position at [employer].  Per patient report he has been cleared from cardiothoracic surgery.  He has been participating in cardiac rehab.  Patient's last visit with cardiothoracic surgery was 04/18/2024 where he go back to all physical activity without restrictions on 05/13/2024.  States he is doing cardiac rehab. States that he does have an appointment tomorrow and will do it on his own after. States his schdule will conflict.  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 States that he has helped someone paint for approx 7 hours. States that he is taking breaks.  At his job he runs a Therapist, music. 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 States that he does walk back and forth to his work station.  Still having some soreness in the chest in the lower sternum.   Review of Systems  Constitutional:  Negative for chills and fever.  Respiratory:  Negative for shortness of breath.   Cardiovascular:  Negative for chest pain (chest wall pain).  Neurological:  Negative for dizziness and headaches.  Psychiatric/Behavioral:  Negative for hallucinations and suicidal ideas.         Objective:    BP 102/62   Pulse 63   Temp 98.1 F (36.7 C) (Oral)   Ht 6' 1 (1.854 m)   Wt 195 lb (88.5 kg)   SpO2 99%   BMI 25.73 kg/m    Physical Exam Vitals and nursing note reviewed.  Constitutional:      Appearance: Normal appearance.  Cardiovascular:     Rate and Rhythm: Normal rate and regular rhythm.     Heart sounds: Normal heart sounds.  Pulmonary:     Effort:  Pulmonary effort is normal.     Breath sounds: Normal breath sounds.  Neurological:     Mental Status: He is alert.     Deep Tendon Reflexes:     Reflex Scores:      Bicep reflexes are 2+ on the right side and 2+ on the left side.    Comments: Bilateral upper extremity strength 5/5     No results found for any visits on 06/18/24.      Assessment & Plan:   Problem List Items Addressed This Visit       Other   S/P CABG x 2 - Primary   Patient is postop status post CABG x 2.  Was cleared by cardiothoracic surgery on 05/13/2024 for return to all activity.  Patient still participating cardiac rehab.  Patient is states going back to work on 06/20/2024.  Form filled out to clear patient along with getting note from the office.  No restrictions at this juncture.  Patient does have a physical job if patient is having difficult time in the beginning he will come back we will consider restrictions such as increased breaks or reducing his duties.       Meds ordered this encounter  Medications   nicotine  (NICODERM CQ ) 7 mg/24hr patch  Sig: Place 1 patch (7 mg total) onto the skin daily.    Dispense:  28 patch    Refill:  0    Supervising Provider:   RANDEEN HARDY A [[id number]]    Return if symptoms worsen or fail to improve.  Adina Crandall, NP

## 2024-06-19 ENCOUNTER — Encounter (HOSPITAL_COMMUNITY)
Admission: RE | Admit: 2024-06-19 | Discharge: 2024-06-19 | Disposition: A | Discharge status: Home / Self Care | Source: Ambulatory Visit | Attending: Cardiology

## 2024-06-19 VITALS — BP 108/60 | HR 66 | Ht 73.0 in | Wt 194.4 lb

## 2024-06-19 DIAGNOSIS — Z951 Presence of aortocoronary bypass graft: Secondary | ICD-10-CM | POA: Diagnosis not present

## 2024-06-19 DIAGNOSIS — Z48812 Encounter for surgical aftercare following surgery on the circulatory system: Secondary | ICD-10-CM | POA: Diagnosis not present

## 2024-06-19 NOTE — Progress Notes (Signed)
 Discharge Progress Report  Patient Details  Name: Parker Gomez MRN: [id number] Date of Birth: [birth year] Referring Provider:   Flowsheet Row INTENSIVE CARDIAC REHAB ORIENT from 04/23/2024 in [facility]  Referring Provider Alluri, Keller Grist, MD  Parker Wilbert SAUNDERS, MD (covering)]     Number of Visits: 27  Reason for Discharge:  Patient reached a stable level of exercise. Patient independent in their exercise. Patient has met program and personal goals.  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  ADL UCSD:   Initial Exercise Prescription:  Initial Exercise Prescription - 04/23/24 1500       Date of Initial Exercise RX and Referring Provider   Date 04/23/24    Referring Provider Alluri, Keller Grist, MD   Parker Wilbert SAUNDERS, MD (covering)   Expected Discharge Date 06/21/24      Recumbant Bike   Level 1    Minutes 15    METs 2.5      NuStep   Level 1    SPM 85    Minutes 15    METs 2.5      Prescription Details   Frequency (times per week) 3    Duration Progress to 30 minutes of continuous aerobic without signs/symptoms of physical distress      Intensity   THRR 40-80% of Max Heartrate 69-138    Ratings of Perceived Exertion 11-13    Perceived Dyspnea 0-4      Progression   Progression Continue to progress workloads to maintain intensity without signs/symptoms of physical distress.      Resistance Training   Training Prescription No          Discharge Exercise Prescription (Final Exercise Prescription Changes):  Exercise Prescription Changes - 06/19/24 1025       Response to Exercise   Blood Pressure (Admit)  108/68    Blood Pressure (Exit) 118/62    Heart Rate (Admit) 66 bpm    Heart Rate (Exercise) 105 bpm    Heart Rate (Exit) 68 bpm    Rating of Perceived Exertion (Exercise) 11    Symptoms None    Comments Parker Gomez completed the cardiac rehab program today.    Duration Continue with 30 min of aerobic exercise without signs/symptoms of physical distress.    Intensity THRR unchanged      Progression   Progression Continue to progress workloads to maintain intensity without signs/symptoms of physical distress.    Average METs 4.2      Resistance Training   Training Prescription No    Weight Relaxation day, no weights.      Interval Training   Interval Training No      Recumbant Bike   Level 3    RPM 56    Watts 57    Minutes 15    METs 3.9      NuStep   Level 5    SPM 116    Minutes 15    METs 4.5      Home Exercise Plan   Plans to continue exercise at Home (comment)   Walking   Frequency Add 2 additional days  to program exercise sessions.    Initial Home Exercises Provided 05/22/24          Functional Capacity:  6 Minute Walk     Row Name 04/23/24 1433 06/17/24 1433       6 Minute Walk   Phase Initial Discharge    Distance 1348 feet 1875 feet    Distance % Change -- 39.09 %    Distance Feet Change -- 547 ft    Walk Time 6 minutes 6 minutes    # of Rest Breaks 0 0    MPH 2.55 3.55    METS 4.29 5.19    RPE 11 9    Perceived Dyspnea  1 0    VO2 Peak 15.02 18.15    Symptoms Yes (comment) No    Comments Mild shortness of breath. Right hip pain-3/10, left shoulder pain-8/10. --    Resting HR 76 bpm 65 bpm    Resting BP 102/62 96/60    Resting Oxygen Saturation  99 % --    Exercise Oxygen Saturation  during 6 min walk 99 % --    Max Ex. HR 86 bpm 87 bpm    Max Ex. BP 112/60 124/58    2 Minute Post BP 112/60 122/60       Psychological, QOL, Others - Outcomes: PHQ 2/9:    06/19/2024   10:23 AM 06/18/2024    8:20 AM 05/29/2024    9:03 AM 04/23/2024    2:07  PM 04/08/2024    4:11 PM  Depression screen PHQ 2/9  Decreased Interest 0 0 0 1 1  Down, Depressed, Hopeless 0 0 1 0 1  PHQ - 2 Score 0 0 1 1 2   Altered sleeping 0 0 3 3 0  Tired, decreased energy 0 0 1 3 2   Change in appetite 0 0 0 2 3  Feeling bad or failure about yourself  0 0 0 1 1  Trouble concentrating 0 0 0 0 0  Moving slowly or fidgety/restless 0 0 0 0 0  Suicidal thoughts 0 0 0 0 0  PHQ-9 Score 0 0 5 10 8   Difficult doing work/chores  Not difficult at all Not difficult at all Somewhat difficult Not difficult at all    Quality of Life:  Quality of Life - 06/19/24 1604       Quality of Life   Select Quality of Life      Quality of Life Scores   Health/Function Pre 16.8 %    Health/Function Post 26.4 %    Health/Function % Change 57.14 %    Socioeconomic Pre 23.14 %    Socioeconomic Post 21 %    Socioeconomic % Change  -9.25 %    Psych/Spiritual Pre 21.43 %    Psych/Spiritual Post 30 %    Psych/Spiritual % Change 39.99 %    Family Pre 22.8 %    Family Post 22.8 %    Family % Change 0 %    GLOBAL Pre 19.94 %    GLOBAL Post 25.37 %    GLOBAL % Change 27.23 %          Personal Goals: Goals established at orientation with interventions provided to work toward goal.  Personal Goals and Risk Factors at Admission - 04/23/24 1341       Core Components/Risk Factors/Patient Goals on Admission    Weight Management Weight Maintenance;Yes    Intervention Weight Management: Develop a combined nutrition and exercise program designed  to reach desired caloric intake, while maintaining appropriate intake of nutrient and fiber, sodium and fats, and appropriate energy expenditure required for the weight goal.;Weight Management: Provide education and appropriate resources to help participant work on and attain dietary goals.    Admit Weight 184 lb 11.9 oz (83.8 kg)    Expected Outcomes Short Term: Continue to assess and modify interventions until short term weight is  achieved;Long Term: Adherence to nutrition and physical activity/exercise program aimed toward attainment of established weight goal;Weight Maintenance: Understanding of the daily nutrition guidelines, which includes 25-35% calories from fat, 7% or less cal from saturated fats, less than 200mg  cholesterol, less than 1.5gm of sodium, & 5 or more servings of fruits and vegetables daily    Tobacco Cessation Yes    Number of packs per day 1.25    Expected Outcomes Long Term: Complete abstinence from all tobacco products for at least 12 months from quit date.    Hypertension Yes    Intervention Provide education on lifestyle modifcations including regular physical activity/exercise, weight management, moderate sodium restriction and increased consumption of fresh fruit, vegetables, and low fat dairy, alcohol moderation, and smoking cessation.;Monitor prescription use compliance.    Expected Outcomes Short Term: Continued assessment and intervention until BP is < 140/93mm HG in hypertensive participants. < 130/81mm HG in hypertensive participants with diabetes, heart failure or chronic kidney disease.;Long Term: Maintenance of blood pressure at goal levels.    Lipids Yes    Intervention Provide education and support for participant on nutrition & aerobic/resistive exercise along with prescribed medications to achieve LDL 70mg , HDL >40mg .    Expected Outcomes Short Term: Participant states understanding of desired cholesterol values and is compliant with medications prescribed. Participant is following exercise prescription and nutrition guidelines.;Long Term: Cholesterol controlled with medications as prescribed, with individualized exercise RX and with personalized nutrition plan. Value goals: LDL < 70mg , HDL > 40 mg.    Stress Yes    Intervention Offer individual and/or small group education and counseling on adjustment to heart disease, stress management and health-related lifestyle change. Teach and  support self-help strategies.;Refer participants experiencing significant psychosocial distress to appropriate mental health specialists for further evaluation and treatment. When possible, include family members and significant others in education/counseling sessions.    Expected Outcomes Short Term: Participant demonstrates changes in health-related behavior, relaxation and other stress management skills, ability to obtain effective social support, and compliance with psychotropic medications if prescribed.;Long Term: Emotional wellbeing is indicated by absence of clinically significant psychosocial distress or social isolation.           Personal Goals Discharge:  Goals and Risk Factor Review     Row Name 05/06/24 1753 05/28/24 1657 07/04/24 1105         Core Components/Risk Factors/Patient Goals Review   Personal Goals Review Weight Management/Obesity;Hypertension;Lipids;Stress;Tobacco Cessation Weight Management/Obesity;Hypertension;Lipids;Stress;Tobacco Cessation Weight Management/Obesity;Hypertension;Lipids;Stress;Tobacco Cessation     Review Markeith started cardiac rehab on 05/06/24. Roosevelt did well with exercise. Vital signs were stable. Nazar did report having some sternal soreness. Advised patient to premedicate with tylenol   priot to coming to exercise, Pt is doing well with exercise at cardiac rehab.  VSS.  Pt has gained 3.2kg since start of program.  MET levels have increased. Keaundre completed  exercise at cardiac rehab on 06/19/24. .  VSS.  Pt has gained 4.4 kg since start of program.  MET levels have increased.     Expected Outcomes Noel will continue to particpate in cardiac rehab for exercise,  nutrition and lifestyle modifications Rachael will continue to particpate in cardiac rehab for exercise, nutrition and lifestyle modifications Hiran will continue to exercise, follow  nutrition and lifestyle modifications upon completion of  cardiac rehab        Exercise Goals  and Review:  Exercise Goals     Row Name 04/23/24 1314             Exercise Goals   Increase Physical Activity Yes       Intervention Provide advice, education, support and counseling about physical activity/exercise needs.;Develop an individualized exercise prescription for aerobic and resistive training based on initial evaluation findings, risk stratification, comorbidities and participant's personal goals.       Expected Outcomes Short Term: Attend rehab on a regular basis to increase amount of physical activity.;Long Term: Exercising regularly at least 3-5 days a week.;Long Term: Add in home exercise to make exercise part of routine and to increase amount of physical activity.       Increase Strength and Stamina Yes       Intervention Provide advice, education, support and counseling about physical activity/exercise needs.;Develop an individualized exercise prescription for aerobic and resistive training based on initial evaluation findings, risk stratification, comorbidities and participant's personal goals.       Expected Outcomes Short Term: Increase workloads from initial exercise prescription for resistance, speed, and METs.;Short Term: Perform resistance training exercises routinely during rehab and add in resistance training at home;Long Term: Improve cardiorespiratory fitness, muscular endurance and strength as measured by increased METs and functional capacity ( )       Able to understand and use rate of perceived exertion (RPE) scale Yes       Intervention Provide education and explanation on how to use RPE scale       Expected Outcomes Short Term: Able to use RPE daily in rehab to express subjective intensity level;Long Term:  Able to use RPE to guide intensity level when exercising independently       Knowledge and understanding of Target Heart Rate Range (THRR) Yes       Intervention Provide education and explanation of THRR including how the numbers were predicted and where  they are located for reference       Expected Outcomes Short Term: Able to state/look up THRR;Long Term: Able to use THRR to govern intensity when exercising independently;Short Term: Able to use daily as guideline for intensity in rehab       Able to check pulse independently Yes       Intervention Provide education and demonstration on how to check pulse in carotid and radial arteries.;Review the importance of being able to check your own pulse for safety during independent exercise       Expected Outcomes Short Term: Able to explain why pulse checking is important during independent exercise;Long Term: Able to check pulse independently and accurately       Understanding of Exercise Prescription Yes       Intervention Provide education, explanation, and written materials on patient's individual exercise prescription       Expected Outcomes Short Term: Able to explain program exercise prescription;Long Term: Able to explain home exercise prescription to exercise independently          Exercise Goals Re-Evaluation:  Exercise Goals Re-Evaluation     Row Name 05/06/24 1127 05/22/24 1054 05/27/24 1114 06/03/24 1114 06/19/24 1100     Exercise Goal Re-Evaluation   Exercise Goals Review Increase Physical Activity;Increase Strength and Stamina;Able to  understand and use rate of perceived exertion (RPE) scale Increase Physical Activity;Increase Strength and Stamina;Able to understand and use rate of perceived exertion (RPE) scale;Knowledge and understanding of Target Heart Rate Range (THRR);Understanding of Exercise Prescription Increase Physical Activity;Increase Strength and Stamina;Able to understand and use rate of perceived exertion (RPE) scale;Knowledge and understanding of Target Heart Rate Range (THRR);Understanding of Exercise Prescription Increase Physical Activity;Increase Strength and Stamina;Able to understand and use rate of perceived exertion (RPE) scale;Knowledge and understanding of Target  Heart Rate Range (THRR);Understanding of Exercise Prescription Increase Physical Activity;Increase Strength and Stamina;Able to understand and use rate of perceived exertion (RPE) scale;Knowledge and understanding of Target Heart Rate Range (THRR);Understanding of Exercise Prescription   Comments Geovonni was able to understand and use RPE scale appropriately. Reviewed exercise prescription with Jveon. His goal is to regain strength and stamina. 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 He has quit smoking 2 months ago and is still successful with tobacco cessation. Toure is making good progress with exercise. He anticipates return to work on 06/23/24. He will likely complete the program at that. Oneal is making good progress with exercise. He is still on schedule to return to work on 06/23/24 but would like to continue to attend cardiac rehab on the days he's not working. Kruz completed the cardiac rehab program today and progressed well, achieving 4.2 METs with exercise. He feels he benefited from participation in the program and is graduating early due to return to work. He has to lift 25-30 lbs for work up to 50 lbs. He plans to continue exercise daily. He is walking 30 minutes at home and has access to a gym at work. His plan is to do cardio and abs MWF, weights TTh, and walking on the weekend. His goal is to increase strength and endurance and he's made progress in those areas. He wants to continue to work on his core strength. He is making healthier meal choices and continues to abstain from tobacco use. He says participation in the program has helped his mindset.   Expected Outcomes Progress workloads as tolerated to help increase strength and stamina. Kevonte will walk at home in addition to exercise at cardiac rehab to achieve 150 minutes of aerobic exercise each week. Continue to progress workloads as tolerated to improve cardiorespiratory fitness. Continue to progress workloads  as tolerated to improve cardiorespiratory fitness. Huzaifa will continue exercise and healthy lifestyle changes.      Nutrition & Weight - Outcomes:  Pre Biometrics - 04/23/24 1310       Pre Biometrics   Waist Circumference 33.75 inches    Hip Circumference 39 inches    Waist to Hip Ratio 0.87 %    Triceps Skinfold 7.5 mm    % Body Fat 19.5 %    Grip Strength 34 kg    Flexibility --   Not performed, chronic back and neck pain   Single Leg Stand 30 seconds          Post Biometrics - 06/19/24 1012        Post  Biometrics   Height 6' 1 (1.854 m)    Waist Circumference 33 inches    Hip Circumference 39 inches    Waist to Hip Ratio 0.85 %    Triceps Skinfold 8 mm    % Body Fat 19.8 %    Grip Strength 30 kg   Equipment changes: Changes may be due to calibration changes with new equipment  Flexibility 14.75 in    Single Leg Stand 30 seconds          Nutrition:  Nutrition Therapy & Goals - 06/19/24 1143       Nutrition Therapy   Diet Heart Healthy Diet    Drug/Food Interactions Statins/Certain Fruits      Personal Nutrition Goals   Nutrition Goal Patient to identify strategies for reducing cardiovascular risk by attending the [employer] education and nutrition series weekly.   goal met.   Personal Goal #2 Patient to improve diet quality by using the plate method as a guide for meal planning to include lean protein/plant protein, fruits, vegetables, whole grains, nonfat dairy as part of a well-balanced diet.   goal in progress.   Comments Goals in progress. Patient has medical history of CABGx2, toabacco abuse. He has attended the [employer] education and nutrition series regularly. LDL is not at goal; he was re-started on 80mg  Lipitor on 05/29/24. He is up 79.6# since starting with our program; he continues to work on decreasing sweets/ice cream since quitting smoking 2 months ago. Patient will benefit from adherence to nutrition, exercise, and lifestyle modification  recommendations.      Intervention Plan   Intervention Prescribe, educate and counsel regarding individualized specific dietary modifications aiming towards targeted core components such as weight, hypertension, lipid management, diabetes, heart failure and other comorbidities.;Nutrition handout(s) given to patient.    Expected Outcomes Short Term Goal: Understand basic principles of dietary content, such as calories, fat, sodium, cholesterol and nutrients.;Long Term Goal: Adherence to prescribed nutrition plan.          Nutrition Discharge:  Nutrition Assessments - 06/21/24 1153       Rate Your Plate Scores   Pre Score --   did not complete   Post Score 43          Education Questionnaire Score:  Knowledge Questionnaire Score - 06/19/24 1100       Knowledge Questionnaire Score   Pre Score 25/28    Post Score 26/28          Goals reviewed with patient; copy given to patient. Anup graduates from  Intensive/Traditional cardiac rehab program on 06/19/24  with completion of 16  exercise and 11 education sessions. Pt maintained good attendance and progressed nicely during their participation in rehab as evidenced by increased MET level. Dimitris increased his distance on his post exercise test  by 547 feet,  Medication list reconciled. Repeat  PHQ score-0  .  Pt has made significant lifestyle changes and should be commended for his success. Yerik  achieved their goals during cardiac rehab. Dontavis gained 4.4 kg while enrolled in the program.   Pt plans to continue exercise at  the gym at his job and walking 30-60 minutes 5-6 days a week. We are proud of Delmer's progress!Hadassah Elpidio Quan RN BSN

## 2024-06-20 ENCOUNTER — Telehealth: Payer: Self-pay

## 2024-06-20 NOTE — Progress Notes (Signed)
 Complex Care Management Note Care Guide Note  06/20/2024 Name: Parker Gomez MRN: [id number] DOB: [birth year]   Complex Care Management Outreach Attempts: An unsuccessful telephone outreach was attempted today to offer the patient information about available complex care management services.  Follow Up Plan:  Additional outreach attempts will be made to offer the patient complex care management information and services.   Encounter Outcome:  No Answer  Dreama [facility]wood [facility]  [facility], [facility] Care Management Assistant Direct Dial: [phone]  Fax: [phone]

## 2024-06-21 ENCOUNTER — Encounter (HOSPITAL_COMMUNITY)
Admission: RE | Admit: 2024-06-21 | Discharge: 2024-06-21 | Disposition: A | Discharge status: Home / Self Care | Source: Ambulatory Visit | Attending: Cardiology | Admitting: Cardiology

## 2024-06-21 DIAGNOSIS — Z951 Presence of aortocoronary bypass graft: Secondary | ICD-10-CM | POA: Insufficient documentation

## 2024-06-21 DIAGNOSIS — Z48812 Encounter for surgical aftercare following surgery on the circulatory system: Secondary | ICD-10-CM | POA: Insufficient documentation

## 2024-06-24 ENCOUNTER — Encounter (HOSPITAL_COMMUNITY)

## 2024-06-25 DIAGNOSIS — F4323 Adjustment disorder with mixed anxiety and depressed mood: Secondary | ICD-10-CM | POA: Diagnosis not present

## 2024-06-26 ENCOUNTER — Encounter (HOSPITAL_COMMUNITY)

## 2024-06-27 NOTE — Progress Notes (Signed)
 Complex Care Management Note Care Guide Note  06/27/2024 Name: Parker Gomez MRN: [id number] DOB: [birth year]   Complex Care Management Outreach Attempts: A second unsuccessful outreach was attempted today to offer the patient with information about available complex care management services.  Follow Up Plan:  No further outreach attempts will be made at this time. We have been unable to contact the patient to offer or enroll patient in complex care management services.  Encounter Outcome:  No Answer  Dreama [facility]wood [facility]  [facility], [facility] Care Management Assistant Direct Dial: [phone]  Fax: [phone]

## 2024-06-28 ENCOUNTER — Other Ambulatory Visit: Payer: Self-pay | Admitting: Nurse Practitioner

## 2024-06-28 ENCOUNTER — Encounter (HOSPITAL_COMMUNITY)

## 2024-07-01 ENCOUNTER — Encounter: Payer: BC Managed Care – PPO | Admitting: Nurse Practitioner

## 2024-07-01 ENCOUNTER — Encounter (HOSPITAL_COMMUNITY)

## 2024-07-03 ENCOUNTER — Encounter (HOSPITAL_COMMUNITY)

## 2024-07-05 ENCOUNTER — Encounter (HOSPITAL_COMMUNITY)

## 2024-07-08 ENCOUNTER — Encounter (HOSPITAL_COMMUNITY)

## 2024-07-09 DIAGNOSIS — F4323 Adjustment disorder with mixed anxiety and depressed mood: Secondary | ICD-10-CM | POA: Diagnosis not present

## 2024-07-10 ENCOUNTER — Telehealth: Payer: Self-pay | Admitting: Nurse Practitioner

## 2024-07-10 ENCOUNTER — Ambulatory Visit (INDEPENDENT_AMBULATORY_CARE_PROVIDER_SITE_OTHER): Admitting: Family Medicine

## 2024-07-10 ENCOUNTER — Ambulatory Visit: Payer: Self-pay

## 2024-07-10 ENCOUNTER — Encounter (HOSPITAL_COMMUNITY)

## 2024-07-10 VITALS — BP 100/60 | HR 59 | Temp 98.5°F | Ht 73.0 in | Wt 197.2 lb

## 2024-07-10 DIAGNOSIS — R350 Frequency of micturition: Secondary | ICD-10-CM | POA: Diagnosis not present

## 2024-07-10 DIAGNOSIS — Z9889 Other specified postprocedural states: Secondary | ICD-10-CM | POA: Diagnosis not present

## 2024-07-10 DIAGNOSIS — N529 Male erectile dysfunction, unspecified: Secondary | ICD-10-CM

## 2024-07-10 DIAGNOSIS — K219 Gastro-esophageal reflux disease without esophagitis: Secondary | ICD-10-CM | POA: Diagnosis not present

## 2024-07-10 LAB — POC URINALSYSI DIPSTICK (AUTOMATED)
Bilirubin, UA: NEGATIVE
Blood, UA: NEGATIVE
Glucose, UA: NEGATIVE
Ketones, UA: NEGATIVE
Leukocytes, UA: NEGATIVE
Nitrite, UA: NEGATIVE
Protein, UA: NEGATIVE
Spec Grav, UA: 1.01 (ref 1.010–1.025)
Urobilinogen, UA: 0.2 U/dL
pH, UA: 6 (ref 5.0–8.0)

## 2024-07-10 MED ORDER — TADALAFIL 10 MG PO TABS
10.0000 mg | ORAL_TABLET | ORAL | 11 refills | Status: AC | PRN
Start: 2024-07-10 — End: ?

## 2024-07-10 MED ORDER — PANTOPRAZOLE SODIUM 40 MG PO TBEC: 40.0000 mg | DELAYED_RELEASE_TABLET | Freq: Every day | ORAL | 1 refills | Status: AC

## 2024-07-10 MED ORDER — CYCLOBENZAPRINE HCL 10 MG PO TABS
10.0000 mg | ORAL_TABLET | Freq: Three times a day (TID) | ORAL | 0 refills | Status: AC | PRN
Start: 2024-07-10 — End: ?

## 2024-07-10 MED ORDER — TAMSULOSIN HCL 0.4 MG PO CAPS: 0.4000 mg | ORAL_CAPSULE | Freq: Every day | ORAL | 3 refills | Status: AC

## 2024-07-10 NOTE — Telephone Encounter (Signed)
 FYI Only or Action Required?: FYI only for provider.  Patient was last seen in primary care on 06/18/2024 by Wendee Lynwood HERO, NP.  Called Nurse Triage reporting Urinary Frequency.  Symptoms began several weeks ago.  Interventions attempted: Nothing.  Symptoms are: unchanged.  Triage Disposition: See Physician Within 24 Hours  Patient/caregiver understands and will follow disposition?: Yes1st attempt mailbox is full, unable to leave message  Copied from CRM #[id number]. Topic: Clinical - Medical Advice >> Jul 10, 2024 10:23 AM Viola F wrote: Reason for CRM: Patient has been having frequent urination that's waking him up during the night and wants to know what to do? Reason for Disposition  Urinating more frequently than usual (i.e., frequency) OR new-onset of the feeling of an urgent need to urinate (i.e., urgency)  Answer Assessment - Initial Assessment Questions 1. SYMPTOM: What's the main symptom you're concerned about? (e.g., frequency, incontinence)     frequency 2. ONSET: When did the  frequency  start?     After hospital discharge 3. PAIN: Is there any pain? If Yes, ask: How bad is it? (Scale: 1-10; mild, moderate, severe)     no 4. CAUSE: What do you think is causing the symptoms?     unknown 5. OTHER SYMPTOMS: Do you have any other symptoms? (e.g., blood in urine, fever, flank pain, pain with urination)     fatigue 6. PREGNANCY: Is there any chance you are pregnant? When was your last menstrual period?     na  Protocols used: Urinary Symptoms-A-AH

## 2024-07-10 NOTE — Telephone Encounter (Signed)
 Patient dropped off [employer] ppwk to be completed by provider placed in providers folder at front desk

## 2024-07-10 NOTE — Telephone Encounter (Signed)
 1st attempt mailbox is full, unable to leave message  Copied from CRM #[id number]. Topic: Clinical - Medical Advice >> Jul 10, 2024 10:23 AM Viola F wrote: Reason for CRM: Patient has been having frequent urination that's waking him up during the night and wants to know what to do?

## 2024-07-10 NOTE — Progress Notes (Signed)
 Yaileen Hofferber T. Xcaret Morad, MD, CAQ Sports Medicine [facility] [address] [place], [address]  Phone: [phone]  FAX: [phone]  Parker Gomez - 48 y.o. male  MRN [id number]  Date of Birth: [birth year]  Date: 07/10/2024  PCP: Wendee Lynwood HERO, NP  Referral: Wendee Lynwood HERO, NP  Chief Complaint  Patient presents with   Urinary Frequency    Mostly at night   Hypotension   Subjective:   Parker Gomez is a 48 y.o. very pleasant male patient with Body mass index is 26.02 kg/m. who presents with the following:  Discussed the use of AI scribe software for clinical note transcription with the patient, who gave verbal consent to proceed.  Urinating a lot  Sees Dr. Wilburn at [facility] History of Present Illness Parker Gomez is a 48 year old male with coronary artery disease who presents with frequent urination and rare heart palpitations.  He experiences frequent urination every one to two hours at night since his coronary artery bypass grafting (CABG) surgery in May 2025. Initially attributed to Lasix, he has been off the medication since June 2025. There is no dysuria, hematuria, or testicular pain. He is monogamous with his wife and had a negative STD test six months prior to his relationship.  He experiences heart palpitations, particularly at night when he wakes up to urinate. He describes his heart as 'fluttering' and sometimes racing. There is no associated chest pain, but he notes hypotension in the evenings, which affects his ability to exercise. He has not resumed cardiac rehabilitation due to returning to work. He has a history of coronary artery disease and underwent CABG in May 2025. He has not seen his cardiologist since June 2025 due to a rescheduled appointment, with the next appointment set for July 30, 2024.  His current medications include aspirin , Lipitor, and Coreg. He has recently stopped smoking and reduced his  coffee intake, although he occasionally consumes a large amount of coffee. He has also been trying to eat more fruits and vegetables as part of his dietary changes post-surgery.  No abdominal pain or changes in bowel habits, although he reports a sensation of tightness in the lower abdomen without significant pain. He attributes recent changes in bowel movements to dietary changes.   Review of Systems is noted in the HPI, as appropriate  Objective:   BP 100/60   Pulse (!) 59   Temp 98.5 F (36.9 C) (Temporal)   Ht 6' 1 (1.854 m)   Wt 197 lb 4 oz (89.5 kg)   SpO2 98%   BMI 26.02 kg/m   GEN: No acute distress; alert,appropriate. CV: RRR, no m/g/r  PULM: Breathing comfortably in no respiratory distress PSYCH: Normally interactive.    Laboratory and Imaging Data:  Results for orders placed or performed in visit on 07/10/24  POCT Urinalysis Dipstick (Automated)   Collection Time: 07/10/24 12:22 PM  Result Value Ref Range   Color, UA Yellow    Clarity, UA Clear    Glucose, UA Negative Negative   Bilirubin, UA Negative    Ketones, UA Negative    Spec Grav, UA 1.010 1.010 - 1.025   Blood, UA Negative    pH, UA 6.0 5.0 - 8.0   Protein, UA Negative Negative   Urobilinogen, UA 0.2 0.2 or 1.0 E.U./dL   Nitrite, UA Negative    Leukocytes, UA Negative Negative    Lab Review:     Latest Ref Rng &  Units 05/29/2024    9:39 AM 03/19/2024    2:12 PM 02/15/2024    5:50 AM  CBC EXTENDED  WBC 4.0 - 10.5 K/uL 5.6  7.7  7.5   RBC 4.22 - 5.81 Mil/uL 4.51  4.01  4.19   Hemoglobin 13.0 - 17.0 g/dL 85.9  86.7  86.1   HCT 39.0 - 52.0 % 41.5  37.9  40.1   Platelets 150.0 - 400.0 K/uL 414.0  347  356        Latest Ref Rng & Units 05/29/2024    9:39 AM 03/19/2024    2:12 PM 02/15/2024    5:50 AM  BMP  Glucose 70 - 99 mg/dL 98  894  888   BUN 6 - 23 mg/dL 17  17  10    Creatinine 0.40 - 1.50 mg/dL 9.06  8.94  9.18   Sodium 135 - 145 mEq/L 139  136  137   Potassium 3.5 - 5.1 mEq/L 4.6  4.3   3.6   Chloride 96 - 112 mEq/L 101  103  105   CO2 19 - 32 mEq/L 32  24  23   Calcium  8.4 - 10.5 mg/dL 9.9  9.3  9.2        Latest Ref Rng & Units 05/29/2024    9:39 AM 02/15/2024    5:50 AM 11/16/2023    3:57 PM  Hepatic Function  Total Protein 6.0 - 8.3 g/dL 6.1  6.1  6.4   Albumin 3.5 - 5.2 g/dL 4.6  4.1  4.6   AST 0 - 37 U/L 13  18  14    ALT 0 - 53 U/L 16  15  17    Alk Phosphatase 39 - 117 U/L 80  58  58   Total Bilirubin 0.2 - 1.2 mg/dL 0.6  0.3  0.4   Bilirubin, Direct 0.0 - 0.2 mg/dL  0.1      Lab Results  Component Value Date   CHOL 202 (H) 05/29/2024   Lab Results  Component Value Date   HDL 59.30 05/29/2024   Lab Results  Component Value Date   LDLCALC 131 (H) 05/29/2024   Lab Results  Component Value Date   TRIG 61.0 05/29/2024   Lab Results  Component Value Date   CHOLHDL 3 05/29/2024   No results for input(s): PSA in the last 72 hours. No results found for: HCVAB Lab Results  Component Value Date   VD25OH 29.28 (L) 04/06/2023     Lab Results  Component Value Date   HGBA1C 5.6 11/09/2023   HGBA1C 5.8 04/06/2023   Lab Results  Component Value Date   LDLCALC 131 (H) 05/29/2024   CREATININE 0.93 05/29/2024     Assessment and Plan:     ICD-10-CM   1. Urinary frequency  R35.0     2. Gastroesophageal reflux disease, unspecified whether esophagitis present  K21.9     3. History of lumbar surgery  Z98.890     4. History of cervical spinal surgery  Z98.890     5. Erectile dysfunction, unspecified erectile dysfunction type  N52.9       Assessment and Plan Assessment & Plan Nocturia Nocturia persists post-CABG, not due to Lasix. Differential includes prostate issues, infection, or medication side effects. Normal PSA, no prostatitis symptoms. High fluid intake, especially coffee, may contribute. Prostate exam declined. - Prescribe Flomax . - Advise reduction in coffee intake. - Consider overactive bladder medication if symptoms  persist.  Palpitations Intermittent palpitations, especially  nocturnal, possibly linked to caffeine . No chest pain reported. EKG declined, cardiology follow-up scheduled. - Advise reduction in caffeine  intake. - Follow-up with cardiology in early September.  History of coronary artery bypass grafting (CABG) Post-CABG low evening blood pressure affects exercise. Cardiologist to manage medications. - Follow-up with cardiology for medication adjustment.     Medication Management during today's office visit: No orders of the defined types were placed in this encounter.  Medications Discontinued During This Encounter  Medication Reason   albuterol  (VENTOLIN  HFA) 108 (90 Base) MCG/ACT inhaler Completed Course   fluticasone (FLONASE) 50 MCG/ACT nasal spray Completed Course   hydrOXYzine  (ATARAX ) 10 MG tablet Completed Course   Sennosides-Docusate Sodium 8.6-50 MG CAPS Completed Course   sennosides-docusate sodium (SENOKOT-S) 8.6-50 MG tablet Completed Course   potassium chloride SA (KLOR-CON M) 20 MEQ tablet Completed Course   acetaminophen  (TYLENOL ) 500 MG tablet Completed Course   furosemide (LASIX) 20 MG tablet Completed Course   metoprolol tartrate (LOPRESSOR) 25 MG tablet Completed Course   oxyCODONE  (OXY IR/ROXICODONE ) 5 MG immediate release tablet Completed Course   ipratropium (ATROVENT) 0.06 % nasal spray Completed Course    Orders placed today for conditions managed today: No orders of the defined types were placed in this encounter.   Disposition: No follow-ups on file.  [employer] speech-to-text software was used for transcription in this dictation.  Possible transcriptional errors can occur using Animal nutritionist.   Signed,  Jacques DASEN. Pennelope Basque, MD   Outpatient Encounter Medications as of 07/10/2024  Medication Sig   aspirin  EC 81 MG tablet Take 81 mg by mouth daily.   atorvastatin  (LIPITOR) 80 MG tablet Take 1 tablet (80 mg total) by mouth daily.    carvedilol (COREG) 3.125 MG tablet Take 3.125 mg by mouth 2 (two) times daily with a meal.   cyclobenzaprine  (FLEXERIL ) 10 MG tablet Take 1 tablet (10 mg total) by mouth 3 (three) times daily as needed for muscle spasms.   nicotine  (NICODERM CQ ) 7 mg/24hr patch Place 1 patch (7 mg total) onto the skin daily. (Patient not taking: Reported on 06/19/2024)   pantoprazole  (PROTONIX ) 40 MG tablet Take 1 tablet (40 mg total) by mouth daily. (Patient taking differently: Take 40 mg by mouth daily. Takes prn)   polyethylene glycol (MIRALAX / GLYCOLAX) 17 g packet Take 17 g by mouth 2 (two) times daily. Mix in 4-8ounces of fluid prior to taking (Patient not taking: Reported on 06/19/2024)   tadalafil  (CIALIS ) 10 MG tablet TAKE 1 TABLET (10 MG TOTAL) BY MOUTH EVERY OTHER DAY AS NEEDED FOR ERECTILE DYSFUNCTION.   [DISCONTINUED] acetaminophen  (TYLENOL ) 500 MG tablet Take 1,000 mg by mouth every 6 (six) hours as needed (every 6 hours as needed).   [DISCONTINUED] albuterol  (VENTOLIN  HFA) 108 (90 Base) MCG/ACT inhaler Inhale 1-2 puffs into the lungs every 6 (six) hours as needed for shortness of breath or wheezing. (Patient not taking: Reported on 06/19/2024)   [DISCONTINUED] fluticasone (FLONASE) 50 MCG/ACT nasal spray Place 2 sprays into both nostrils daily as needed for allergies.   [DISCONTINUED] furosemide (LASIX) 20 MG tablet Take 20 mg by mouth daily. (Patient not taking: Reported on 06/19/2024)   [DISCONTINUED] hydrOXYzine  (ATARAX ) 10 MG tablet Take 1 tablet (10 mg total) by mouth 3 (three) times daily as needed. (Patient not taking: Reported on 06/19/2024)   [DISCONTINUED] ipratropium (ATROVENT) 0.06 % nasal spray Place 2 sprays into both nostrils 4 (four) times daily. (Patient not taking: Reported on 06/19/2024)   [DISCONTINUED] metoprolol  tartrate (LOPRESSOR) 25 MG tablet Take 25 mg by mouth 2 (two) times daily. (Patient not taking: Reported on 06/19/2024)   [DISCONTINUED] oxyCODONE  (OXY IR/ROXICODONE ) 5 MG  immediate release tablet Take 5 mg by mouth every 6 (six) hours as needed for severe pain (pain score 7-10) (for Pain for up to 7 days).   [DISCONTINUED] potassium chloride SA (KLOR-CON M) 20 MEQ tablet Take 20 mEq by mouth 2 (two) times daily. (Patient not taking: Reported on 06/19/2024)   [DISCONTINUED] sennosides-docusate sodium (SENOKOT-S) 8.6-50 MG tablet Take 2 tablets by mouth 2 (two) times daily. (Patient not taking: Reported on 06/19/2024)   [DISCONTINUED] Sennosides-Docusate Sodium 8.6-50 MG CAPS Take 2 tablets by mouth 2 (two) times daily. (Patient not taking: Reported on 06/19/2024)   No facility-administered encounter medications on file as of 07/10/2024.

## 2024-07-12 ENCOUNTER — Encounter (HOSPITAL_COMMUNITY)

## 2024-07-15 ENCOUNTER — Encounter (HOSPITAL_COMMUNITY)

## 2024-07-15 NOTE — Telephone Encounter (Signed)
 Placed in Erin's box

## 2024-07-15 NOTE — Telephone Encounter (Signed)
 FMLA forms received   Patient is requesting intermittent leave, 5 hrs-1 day per episode 3 x a month.  Patient notified via Mychart that provider was out of the office until 9.2.25 and would review forms when returning to office.  Forms placed in providers box for review

## 2024-07-24 DIAGNOSIS — Z0279 Encounter for issue of other medical certificate: Secondary | ICD-10-CM

## 2024-07-24 NOTE — Telephone Encounter (Signed)
 Completed FMLA forms received and faxed to [facility] at [phone]  Copy sent to scan  Copy left at the front desk for patient to pick up at his convenience. Notified via Mychart.

## 2024-07-25 ENCOUNTER — Encounter: Payer: Self-pay | Admitting: Nurse Practitioner

## 2024-07-25 NOTE — Telephone Encounter (Signed)
 Pt came by and picked up fmla forms.

## 2024-07-30 DIAGNOSIS — I2510 Atherosclerotic heart disease of native coronary artery without angina pectoris: Secondary | ICD-10-CM | POA: Diagnosis not present

## 2024-07-30 DIAGNOSIS — Z951 Presence of aortocoronary bypass graft: Secondary | ICD-10-CM | POA: Diagnosis not present

## 2024-07-30 DIAGNOSIS — E782 Mixed hyperlipidemia: Secondary | ICD-10-CM | POA: Diagnosis not present

## 2024-07-30 DIAGNOSIS — Z8249 Family history of ischemic heart disease and other diseases of the circulatory system: Secondary | ICD-10-CM | POA: Diagnosis not present

## 2024-08-06 DIAGNOSIS — F4323 Adjustment disorder with mixed anxiety and depressed mood: Secondary | ICD-10-CM | POA: Diagnosis not present

## 2024-08-29 ENCOUNTER — Encounter: Admitting: Nurse Practitioner

## 2024-10-22 ENCOUNTER — Encounter: Admitting: Nurse Practitioner

## 2024-10-28 ENCOUNTER — Encounter: Admitting: Nurse Practitioner

## 2024-11-24 ENCOUNTER — Other Ambulatory Visit: Payer: Self-pay | Admitting: Nurse Practitioner

## 2024-11-24 DIAGNOSIS — Z951 Presence of aortocoronary bypass graft: Secondary | ICD-10-CM

## 2024-12-25 ENCOUNTER — Ambulatory Visit: Admitting: Nurse Practitioner

## 2024-12-25 ENCOUNTER — Encounter: Payer: Self-pay | Admitting: Nurse Practitioner

## 2024-12-25 VITALS — BP 118/64 | HR 70 | Temp 98.0°F | Ht 71.95 in | Wt 218.6 lb

## 2024-12-25 DIAGNOSIS — E559 Vitamin D deficiency, unspecified: Secondary | ICD-10-CM | POA: Diagnosis not present

## 2024-12-25 DIAGNOSIS — R5383 Other fatigue: Secondary | ICD-10-CM | POA: Diagnosis not present

## 2024-12-25 DIAGNOSIS — N529 Male erectile dysfunction, unspecified: Secondary | ICD-10-CM

## 2024-12-25 DIAGNOSIS — Z114 Encounter for screening for human immunodeficiency virus [HIV]: Secondary | ICD-10-CM

## 2024-12-25 DIAGNOSIS — K219 Gastro-esophageal reflux disease without esophagitis: Secondary | ICD-10-CM | POA: Diagnosis not present

## 2024-12-25 DIAGNOSIS — Z125 Encounter for screening for malignant neoplasm of prostate: Secondary | ICD-10-CM

## 2024-12-25 DIAGNOSIS — Z87891 Personal history of nicotine dependence: Secondary | ICD-10-CM

## 2024-12-25 DIAGNOSIS — R0600 Dyspnea, unspecified: Secondary | ICD-10-CM | POA: Insufficient documentation

## 2024-12-25 DIAGNOSIS — Z951 Presence of aortocoronary bypass graft: Secondary | ICD-10-CM

## 2024-12-25 DIAGNOSIS — Z9889 Other specified postprocedural states: Secondary | ICD-10-CM | POA: Diagnosis not present

## 2024-12-25 DIAGNOSIS — R351 Nocturia: Secondary | ICD-10-CM | POA: Diagnosis not present

## 2024-12-25 DIAGNOSIS — R600 Localized edema: Secondary | ICD-10-CM | POA: Insufficient documentation

## 2024-12-25 DIAGNOSIS — Z1159 Encounter for screening for other viral diseases: Secondary | ICD-10-CM

## 2024-12-25 DIAGNOSIS — Z0000 Encounter for general adult medical examination without abnormal findings: Secondary | ICD-10-CM

## 2024-12-25 LAB — CBC WITH DIFFERENTIAL/PLATELET
Basophils Absolute: 0 10*3/uL (ref 0.0–0.1)
Basophils Relative: 0.8 % (ref 0.0–3.0)
Eosinophils Absolute: 0.2 10*3/uL (ref 0.0–0.7)
Eosinophils Relative: 2.8 % (ref 0.0–5.0)
HCT: 42.6 % (ref 39.0–52.0)
Hemoglobin: 14.4 g/dL (ref 13.0–17.0)
Lymphocytes Relative: 25.1 % (ref 12.0–46.0)
Lymphs Abs: 1.5 10*3/uL (ref 0.7–4.0)
MCHC: 33.9 g/dL (ref 30.0–36.0)
MCV: 91.1 fl (ref 78.0–100.0)
Monocytes Absolute: 0.6 10*3/uL (ref 0.1–1.0)
Monocytes Relative: 10.4 % (ref 3.0–12.0)
Neutro Abs: 3.6 10*3/uL (ref 1.4–7.7)
Neutrophils Relative %: 60.9 % (ref 43.0–77.0)
Platelets: 428 10*3/uL — ABNORMAL HIGH (ref 150.0–400.0)
RBC: 4.68 Mil/uL (ref 4.22–5.81)
RDW: 13.6 % (ref 11.5–15.5)
WBC: 5.9 10*3/uL (ref 4.0–10.5)

## 2024-12-25 LAB — COMPREHENSIVE METABOLIC PANEL WITH GFR
ALT: 19 U/L (ref 3–53)
AST: 14 U/L (ref 5–37)
Albumin: 4.5 g/dL (ref 3.5–5.2)
Alkaline Phosphatase: 67 U/L (ref 39–117)
BUN: 14 mg/dL (ref 6–23)
CO2: 29 meq/L (ref 19–32)
Calcium: 9.7 mg/dL (ref 8.4–10.5)
Chloride: 106 meq/L (ref 96–112)
Creatinine, Ser: 0.87 mg/dL (ref 0.40–1.50)
GFR: 102.55 mL/min
Glucose, Bld: 89 mg/dL (ref 70–99)
Potassium: 5.1 meq/L (ref 3.5–5.1)
Sodium: 140 meq/L (ref 135–145)
Total Bilirubin: 0.5 mg/dL (ref 0.2–1.2)
Total Protein: 6.1 g/dL (ref 6.0–8.3)

## 2024-12-25 LAB — LIPID PANEL
Cholesterol: 127 mg/dL (ref 28–200)
HDL: 57.5 mg/dL
LDL Cholesterol: 61 mg/dL (ref 10–99)
NonHDL: 69.51
Total CHOL/HDL Ratio: 2
Triglycerides: 43 mg/dL (ref 10.0–149.0)
VLDL: 8.6 mg/dL (ref 0.0–40.0)

## 2024-12-25 LAB — TSH: TSH: 1.08 u[IU]/mL (ref 0.35–5.50)

## 2024-12-25 LAB — URINALYSIS, MICROSCOPIC ONLY
RBC / HPF: NONE SEEN
WBC, UA: NONE SEEN

## 2024-12-25 LAB — VITAMIN B12: Vitamin B-12: 225 pg/mL (ref 211–911)

## 2024-12-25 LAB — HEMOGLOBIN A1C: Hgb A1c MFr Bld: 5.9 % (ref 4.6–6.5)

## 2024-12-25 LAB — PSA: PSA: 1.14 ng/mL (ref 0.10–4.00)

## 2024-12-25 LAB — BRAIN NATRIURETIC PEPTIDE: Pro B Natriuretic peptide (BNP): 83 pg/mL (ref 1.0–100.0)

## 2024-12-25 LAB — VITAMIN D 25 HYDROXY (VIT D DEFICIENCY, FRACTURES): VITD: 22 ng/mL — ABNORMAL LOW (ref 30.00–100.00)

## 2024-12-25 NOTE — Assessment & Plan Note (Signed)
 Multifactorial likely sleep apnea given PND and morning headaches.  Check B12, TSH, CBC, vitamin D  and referral for at home sleep study.

## 2024-12-25 NOTE — Assessment & Plan Note (Signed)
 Currently maintained on atorvastatin  80 mg daily and ASA 81 mg daily.  Pending lipid panel today followed by cardiology

## 2024-12-25 NOTE — Assessment & Plan Note (Signed)
 Ambulatory referral to home sleep study rule out sleep apnea

## 2024-12-25 NOTE — Assessment & Plan Note (Signed)
 History of same status post CABG.  Patient on tadalafil  10 mg every other day as needed sexual intercourse.  Continue

## 2024-12-25 NOTE — Assessment & Plan Note (Signed)
 Patient is a in the setting of fatigue pending vitamin D  level today

## 2024-12-25 NOTE — Assessment & Plan Note (Signed)
 Discussed age-appropriate immunizations and screening exams.  Did review patient's personal, surgical, social, family histories.  Patient is up-to-date with all age-appropriate vaccinations he would like.  Patient declined flu vaccine and pneumonia vaccine.  Patient up-to-date on CRC screening.  PSA for prostate cancer screening.  Patient was given information at discharge about preventative healthcare maintenance with anticipatory guidance.

## 2024-12-25 NOTE — Patient Instructions (Signed)
 Nice to see you today Follow up in a year for your physical Sooner for the other complaints

## 2024-12-25 NOTE — Assessment & Plan Note (Signed)
 Unclear etiology with coronary artery disease will check BNP.  Most recent echo reviewed on 03/28/2024 that showed normal EF.

## 2024-12-25 NOTE — Assessment & Plan Note (Signed)
 Patient currently maintained on Protonix  40 mg daily.  Symptoms controlled continue

## 2024-12-25 NOTE — Progress Notes (Signed)
 "  Established Patient Office Visit  Subjective   Patient ID: Parker Gomez, male    DOB: 1975/12/31  Age: 49 y.o. MRN: [id number]  Chief Complaint  Patient presents with   Annual Exam   Referral    Sleep study.     HPI  CAD: Patient currently maintained on atorvastatin  80 mg daily and aspirin  81 mg daily status post CABG.  Patient also maintained on carvedilol 3.125 mg twice daily. States that he has stopped taking the carvidolol.  GERD: Currently maintained on Protonix  40 mg daily  ED: Maintained on tadalafil  10 mg every other day as needed sexual intercourse  BPH: he did the flomax  for approx 10 days. States that his hips and lower back is hurting. States that his lower legs are swelling at the end of the day. States that it has been for a few months. States that he is working on kegel exercises  for complete physical and follow up of chronic conditions.  Immunizations: -Tetanus: Completed in 2021 -Influenza: declined -Shingles: Too young -Pneumonia: Discussed in office  Diet: Fair diet. He is eating a variety. States that he does have some fruits and salads but does have some not healthy foods. He is trying to get more whole grains.  Exercise: No regular exercise. Currently due to his back   Eye exam: Completes annually. Wears galsses  Dental exam: Completes semi-annually    Colonoscopy: Completed in 08/25/2022, repeat 10 years.  Patient due 2033 Lung Cancer Screening: Former smoker but does not qualify due to age  PSA: Due  Sleep: going to bed around  States that he will sleep for 30 mins and feels great. States that he will get up and go to the bathroom and then go back to bed. He will sleep for an hour and wake back up with a headache. States that he has had PND with gasping. States that he is flow is going well. He is working on his kegel exercises         Review of Systems  Constitutional:  Negative for chills and fever.  Respiratory:  Negative for  shortness of breath.   Cardiovascular:  Negative for chest pain and leg swelling.  Gastrointestinal:  Negative for abdominal pain, blood in stool, constipation, diarrhea, nausea and vomiting.       BM is usually every day but with some hard balls and   Genitourinary:  Negative for dysuria and hematuria.  Neurological:  Negative for tingling and headaches.  Psychiatric/Behavioral:  Negative for hallucinations and suicidal ideas.       Objective:     BP 118/64   Pulse 70   Temp 98 F (36.7 C) (Oral)   Ht 5' 11.95 (1.828 m)   Wt 218 lb 9.6 oz (99.2 kg)   SpO2 98%   BMI 29.69 kg/m  BP Readings from Last 3 Encounters:  12/25/24 118/64  07/10/24 100/60  06/19/24 108/60   Wt Readings from Last 3 Encounters:  12/25/24 218 lb 9.6 oz (99.2 kg)  07/10/24 197 lb 4 oz (89.5 kg)  06/19/24 194 lb 7.1 oz (88.2 kg)   SpO2 Readings from Last 3 Encounters:  12/25/24 98%  07/10/24 98%  06/18/24 99%      Physical Exam Vitals and nursing note reviewed.  Constitutional:      Appearance: Normal appearance.  HENT:     Right Ear: Tympanic membrane, ear canal and external ear normal.     Left Ear: Tympanic membrane, ear canal and  external ear normal.     Mouth/Throat:     Mouth: Mucous membranes are moist.     Pharynx: Oropharynx is clear.  Eyes:     Extraocular Movements: Extraocular movements intact.     Pupils: Pupils are equal, round, and reactive to light.  Cardiovascular:     Rate and Rhythm: Normal rate and regular rhythm.     Pulses: Normal pulses.     Heart sounds: Normal heart sounds.  Pulmonary:     Effort: Pulmonary effort is normal.     Breath sounds: Normal breath sounds.  Abdominal:     General: Bowel sounds are normal. There is no distension.     Palpations: There is no mass.     Tenderness: There is abdominal tenderness.     Hernia: No hernia is present.  Musculoskeletal:     Right lower leg: Edema present.     Left lower leg: Edema present.  Lymphadenopathy:      Cervical: No cervical adenopathy.  Skin:    General: Skin is warm.  Neurological:     General: No focal deficit present.     Mental Status: He is alert.     Deep Tendon Reflexes:     Reflex Scores:      Bicep reflexes are 2+ on the right side and 2+ on the left side.      Patellar reflexes are 2+ on the right side and 2+ on the left side.    Comments: Bilateral upper and lower extremity strength 5/5  Psychiatric:        Mood and Affect: Mood normal.        Behavior: Behavior normal.        Thought Content: Thought content normal.        Judgment: Judgment normal.      No results found for any visits on 12/25/24.    The 10-year ASCVD risk score (Arnett DK, et al., 2019) is: 5.3%    Assessment & Plan:   Problem List Items Addressed This Visit       Digestive   Gastroesophageal reflux disease   Patient currently maintained on Protonix  40 mg daily.  Symptoms controlled continue        Other   Preventative health care - Primary   Discussed age-appropriate immunizations and screening exams.  Did review patient's personal, surgical, social, family histories.  Patient is up-to-date with all age-appropriate vaccinations he would like.  Patient declined flu vaccine and pneumonia vaccine.  Patient up-to-date on CRC screening.  PSA for prostate cancer screening.  Patient was given information at discharge about preventative healthcare maintenance with anticipatory guidance.      Relevant Orders   CBC with Differential/Platelet   Comprehensive metabolic panel with GFR   TSH   History of lumbar surgery   History of cervical spinal surgery   Other fatigue   Multifactorial likely sleep apnea given PND and morning headaches.  Check B12, TSH, CBC, vitamin D  and referral for at home sleep study.      Relevant Orders   CBC with Differential/Platelet   TSH   Vitamin B12   Vitamin D  deficiency   Patient is a in the setting of fatigue pending vitamin D  level today       Relevant Orders   VITAMIN D  25 Hydroxy (Vit-D Deficiency, Fractures)   Erectile dysfunction   History of same status post CABG.  Patient on tadalafil  10 mg every other day as needed sexual intercourse.  Continue      S/P CABG x 2   Currently maintained on atorvastatin  80 mg daily and ASA 81 mg daily.  Pending lipid panel today followed by cardiology      Relevant Orders   Hemoglobin A1c   Lipid panel   Nocturia   Pending urinalysis.  Likely secondary to uncontrolled presumed sleep apnea will check PSA also      Relevant Orders   Urinalysis w microscopic + reflex cultur   PND (paroxysmal nocturnal dyspnea)   Ambulatory referral to home sleep study rule out sleep apnea      Relevant Orders   Ambulatory referral to Sleep Studies   Lower extremity edema   Unclear etiology with coronary artery disease will check BNP.  Most recent echo reviewed on 03/28/2024 that showed normal EF.      Relevant Orders   Brain natriuretic peptide   Other Visit Diagnoses       Encounter for hepatitis C screening test for low risk patient       Relevant Orders   Hepatitis C antibody     Encounter for screening for HIV       Relevant Orders   HIV antibody (with reflex)     Screening for prostate cancer       Relevant Orders   PSA     Former smoker       Relevant Orders   Urine Microscopic       Return in about 1 year (around 12/25/2025) for CPE and Labs.    Adina Crandall, NP  "

## 2024-12-25 NOTE — Assessment & Plan Note (Signed)
 Pending urinalysis.  Likely secondary to uncontrolled presumed sleep apnea will check PSA also

## 2024-12-26 LAB — URINALYSIS W MICROSCOPIC + REFLEX CULTURE
Bacteria, UA: NONE SEEN /HPF
Bilirubin Urine: NEGATIVE
Glucose, UA: NEGATIVE
Hgb urine dipstick: NEGATIVE
Ketones, ur: NEGATIVE
Leukocyte Esterase: NEGATIVE
Nitrites, Initial: NEGATIVE
Protein, ur: NEGATIVE
RBC / HPF: NONE SEEN /HPF (ref 0–2)
Specific Gravity, Urine: 1.014 (ref 1.001–1.035)
Squamous Epithelial / HPF: NONE SEEN /HPF
WBC, UA: NONE SEEN /HPF (ref 0–5)
pH: <=5 — AB (ref 5.0–8.0)

## 2024-12-26 LAB — HEPATITIS C ANTIBODY: Hepatitis C Ab: NONREACTIVE

## 2024-12-26 LAB — NO CULTURE INDICATED

## 2024-12-26 LAB — HIV ANTIBODY (ROUTINE TESTING W REFLEX)
HIV 1&2 Ab, 4th Generation: NONREACTIVE
HIV FINAL INTERPRETATION: NEGATIVE

## 2025-01-01 ENCOUNTER — Encounter: Admitting: Nurse Practitioner

## 2025-01-02 ENCOUNTER — Ambulatory Visit

## 2025-01-06 ENCOUNTER — Ambulatory Visit: Admitting: Nurse Practitioner

## 2025-12-26 ENCOUNTER — Encounter: Admitting: Nurse Practitioner
